# Patient Record
Sex: Male | Born: 1990 | Race: White | Hispanic: Yes | Smoking: Never smoker
Health system: Southern US, Community
[De-identification: ages and names within clinical notes are randomized; demographics above are authoritative.]

## PROBLEM LIST (undated history)

## (undated) ENCOUNTER — Emergency Department (HOSPITAL_COMMUNITY): Admission: EM | Payer: Self-pay | Source: Home / Self Care

## (undated) HISTORY — PX: APPENDECTOMY: SHX54

---
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
## 2013-05-01 ENCOUNTER — Encounter (HOSPITAL_COMMUNITY): Payer: Self-pay | Admitting: Emergency Medicine

## 2013-05-01 ENCOUNTER — Emergency Department (HOSPITAL_COMMUNITY)
Admission: EM | Admit: 2013-05-01 | Discharge: 2013-05-01 | Disposition: A | Discharge status: Home / Self Care | Payer: Self-pay | Attending: Emergency Medicine | Admitting: Emergency Medicine

## 2013-05-01 DIAGNOSIS — N400 Enlarged prostate without lower urinary tract symptoms: Secondary | ICD-10-CM | POA: Insufficient documentation

## 2013-05-01 LAB — CBC WITH DIFFERENTIAL/PLATELET
Basophils Absolute: 0 10*3/uL (ref 0.0–0.1)
Basophils Relative: 0 % (ref 0–1)
HCT: 40.7 % (ref 39.0–52.0)
Lymphocytes Relative: 6 % — ABNORMAL LOW (ref 12–46)
MCHC: 35.9 g/dL (ref 30.0–36.0)
Monocytes Absolute: 0.1 10*3/uL (ref 0.1–1.0)
Neutro Abs: 8.6 10*3/uL — ABNORMAL HIGH (ref 1.7–7.7)
Neutrophils Relative %: 94 % — ABNORMAL HIGH (ref 43–77)
RDW: 12.7 % (ref 11.5–15.5)
WBC: 9.2 10*3/uL (ref 4.0–10.5)

## 2013-05-01 LAB — URINALYSIS, ROUTINE W REFLEX MICROSCOPIC
Glucose, UA: NEGATIVE mg/dL
Ketones, ur: NEGATIVE mg/dL
Leukocytes, UA: NEGATIVE
Nitrite: NEGATIVE
Protein, ur: NEGATIVE mg/dL
pH: 7.5 (ref 5.0–8.0)

## 2013-05-01 LAB — BASIC METABOLIC PANEL
CO2: 27 mEq/L (ref 19–32)
Chloride: 102 mEq/L (ref 96–112)
Creatinine, Ser: 0.96 mg/dL (ref 0.50–1.35)
GFR calc Af Amer: >90 mL/min (ref >90)
Potassium: 3.6 mEq/L (ref 3.5–5.1)
Sodium: 140 mEq/L (ref 135–145)

## 2013-05-01 NOTE — ED Provider Notes (Signed)
History    This chart was scribed for non-physician, Junious Silk PA-C, practitioner working with Glynn Octave, MD by Donne Anon, ED Scribe. This patient was seen in room WTR7/WTR7 and the patient's care was started at 1607.   CSN: [id number]  Arrival date & time 05/01/13  1548   First MD Initiated Contact with Patient 05/01/13 1607      Chief Complaint  Patient presents with  . Benign Prostatic Hypertrophy     The history is provided by the patient. No language interpreter was used.   HPI Comments: Vincent Braun is a 22 y.o. male who presents to the Emergency Department complaining of an enlarged prostate described as pressure between his scrotum and his anus that he noticed 5 days ago while working out. It is not worsening, but it is not improving. He states he has been seen by his PCP who performed an exam and diagnosed him with an enlarged prostate. He would like labs run as well as a second opinion. He is scheduled to go to [facility] on Sunday. He was given Rocephin, Decadron and Toradol injection at his PCP. He denies urinary frequency, urgency, dysuria, trouble starting a stream, nausea, vomiting, fever, abdominal pain or any other pain.  History reviewed. No pertinent past medical history.  Past Surgical History  Procedure Laterality Date  . Appendectomy      No family history on file.  History  Substance Use Topics  . Smoking status: Never Smoker   . Smokeless tobacco: Not on file  . Alcohol Use: No      Review of Systems  Constitutional: Negative for fever.  Gastrointestinal: Negative for nausea, vomiting and abdominal pain.  All other systems reviewed and are negative.    Allergies  Review of patient's allergies indicates no known allergies.  Home Medications  No current outpatient prescriptions on file.  BP 133/54  Pulse 71  Temp(Src) 98.4 F (36.9 C) (Oral)  Resp 18  SpO2 100%  Physical Exam  Nursing note and vitals  reviewed. Constitutional: He is oriented to person, place, and time. He appears well-developed and well-nourished. No distress.  HENT:  Head: Normocephalic and atraumatic.  Right Ear: External ear normal.  Left Ear: External ear normal.  Nose: Nose normal.  Eyes: Conjunctivae are normal.  Neck: Normal range of motion. No tracheal deviation present.  Cardiovascular: Normal rate, regular rhythm and normal heart sounds.   Pulmonary/Chest: Effort normal and breath sounds normal. No stridor.  Abdominal: Soft. He exhibits no distension. There is no tenderness.  Musculoskeletal: Normal range of motion.  Neurological: He is alert and oriented to person, place, and time.  Skin: Skin is warm and dry. He is not diaphoretic.  Psychiatric: He has a normal mood and affect. His behavior is normal.    ED Course  Procedures (including critical care time) DIAGNOSTIC STUDIES: Oxygen Saturation is 100% on RA, normal by my interpretation.    COORDINATION OF CARE: 4:19 PM Discussed treatment plan which includes rectal exam with pt at bedside and pt agreed to plan.   6:02 PM Rechecked pt. Discussed various possible causes, diagnostic studies and treatments.   Labs Reviewed  CBC WITH DIFFERENTIAL - Abnormal; Notable for the following:    Neutrophils Relative % 94 (*)    Neutro Abs 8.6 (*)    Lymphocytes Relative 6 (*)    Lymphs Abs 0.5 (*)    Monocytes Relative 1 (*)    All other components within normal limits  BASIC  METABOLIC PANEL - Abnormal; Notable for the following:    Glucose, Bld 142 (*)    All other components within normal limits  URINALYSIS, ROUTINE W REFLEX MICROSCOPIC   No results found.   1. Enlarged prostate       MDM  Discussed with patient that he was given the correct medication at his PCP. Patient denied repeat rectal exam today. Encouraged him to take medication as prescribed by his PCP. CBC, BMP, UA without concerning acute findings. He is very concerned about prostate  cancer. Discussed at length that there are many reasons for your prostate to be enlarged and that we do not test for prostate cancer in the emergency department and he should follow up with his pcp in 1 week. Vital signs stable for discharge. Return instructions given. Vital signs stable for discharge. Patient / Family / Caregiver informed of clinical course, understand medical decision-making process, and agree with plan.    I personally performed the services described in this documentation, which was scribed in my presence. The recorded information has been reviewed and is accurate.         Mora Bellman, PA-C 05/02/13 1106

## 2013-05-01 NOTE — ED Notes (Signed)
Per pt, states he works out a lot and noticed something wrong with his "private area"-went to PCP and he did exam and noticed an  Enlarged prostate-wants results of blood work sooner than later so he came to ED

## 2013-05-01 NOTE — ED Notes (Signed)
Per pt, grandfather has prostate cancer

## 2013-05-02 NOTE — ED Provider Notes (Signed)
Medical screening examination/treatment/procedure(s) were performed by non-physician practitioner and as supervising physician I was immediately available for consultation/collaboration.   Glynn Octave, MD 05/02/13 1147

## 2013-05-19 ENCOUNTER — Emergency Department (HOSPITAL_COMMUNITY): Payer: Self-pay

## 2013-05-19 ENCOUNTER — Emergency Department (HOSPITAL_COMMUNITY)
Admission: EM | Admit: 2013-05-19 | Discharge: 2013-05-19 | Disposition: A | Discharge status: Home / Self Care | Payer: Self-pay | Attending: Emergency Medicine | Admitting: Emergency Medicine

## 2013-05-19 ENCOUNTER — Encounter (HOSPITAL_COMMUNITY): Payer: Self-pay | Admitting: Emergency Medicine

## 2013-05-19 DIAGNOSIS — N50811 Right testicular pain: Secondary | ICD-10-CM

## 2013-05-19 DIAGNOSIS — Z79899 Other long term (current) drug therapy: Secondary | ICD-10-CM | POA: Insufficient documentation

## 2013-05-19 DIAGNOSIS — N509 Disorder of male genital organs, unspecified: Secondary | ICD-10-CM | POA: Insufficient documentation

## 2013-05-19 LAB — URINALYSIS, ROUTINE W REFLEX MICROSCOPIC
Bilirubin Urine: NEGATIVE
Glucose, UA: NEGATIVE mg/dL
Hgb urine dipstick: NEGATIVE
Ketones, ur: NEGATIVE mg/dL
Protein, ur: NEGATIVE mg/dL
Urobilinogen, UA: 0.2 mg/dL (ref 0.0–1.0)

## 2013-05-19 NOTE — ED Notes (Signed)
Patient returned from ultrasound.

## 2013-05-19 NOTE — ED Notes (Addendum)
Pt presenting to ed with c/o groin pain and cramping in his testicle pt states he was seen here recently for the same and has been on antibiotics pt states he was told his prostate was enlarged at the clinic pt states he also had blood work drawn and was told that his labs were fine. 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 Pt denies hematuria at this time. Pt denies discharge. Pt states right testicle pain is worse than left

## 2013-05-19 NOTE — ED Notes (Signed)
Drinking water at bedside.

## 2013-05-19 NOTE — Progress Notes (Signed)
[facility] has seen patient and provided him with a oc application. °

## 2013-05-19 NOTE — ED Notes (Signed)
Patient transported to Ultrasound 

## 2013-05-19 NOTE — ED Provider Notes (Signed)
History    CSN: [id number] Arrival date & time 05/19/13  1328  First MD Initiated Contact with Patient 05/19/13 1508     Chief Complaint  Patient presents with  . Groin Pain   (Consider location/radiation/quality/duration/timing/severity/associated sxs/prior Treatment) HPI Comments: Pt comes in with right sided testicular pain. Seen recently at an urgent care, diagnosed with BPH, put on cipro. Pt finished the course, just 2 days ago, but states that he has developed a right testicular pain. Pt denies nausea, emesis, fevers, chills, chest pains, shortness of breath, headaches, abdominal pain, uti like symptoms. He has no weight loss, no hx of testicular tumors in the family. No trauma, no penile discharge, no hx of STD, or risk factors for the same.   Patient is a 22 y.o. male presenting with groin pain. The history is provided by the patient and medical records.  Groin Pain Pertinent negatives include no chest pain, no abdominal pain and no shortness of breath.   History reviewed. No pertinent past medical history. Past Surgical History  Procedure Laterality Date  . Appendectomy     No family history on file. History  Substance Use Topics  . Smoking status: Never Smoker   . Smokeless tobacco: Not on file  . Alcohol Use: No    Review of Systems  Constitutional: Negative for activity change and appetite change.  Respiratory: Negative for cough and shortness of breath.   Cardiovascular: Negative for chest pain.  Gastrointestinal: Negative for abdominal pain.  Genitourinary: Negative for dysuria.    Allergies  Review of patient's allergies indicates no known allergies.  Home Medications   Current Outpatient Rx  Name  Route  Sig  Dispense  Refill  . naproxen sodium (ANAPROX) 220 MG tablet   Oral   Take 220 mg by mouth 2 (two) times daily as needed (for pain).         . ciprofloxacin (CIPRO) 500 MG tablet   Oral   Take 500 mg by mouth 2 (two) times daily.           BP 140/55  Pulse 59  Temp(Src) 98.2 F (36.8 C) (Oral)  Resp 18  SpO2 100% Physical Exam  Constitutional: He is oriented to person, place, and time. He appears well-developed.  HENT:  Head: Normocephalic and atraumatic.  Eyes: Conjunctivae and EOM are normal. Pupils are equal, round, and reactive to light.  Neck: Normal range of motion. Neck supple.  Cardiovascular: Normal rate and regular rhythm.   Pulmonary/Chest: Effort normal and breath sounds normal.  Abdominal: Soft. Bowel sounds are normal. He exhibits no distension. There is no tenderness. There is no rebound and no guarding.  Genitourinary: Penis normal. No penile tenderness.  Right testicle slightly enlarged compared to left. No masses, no fluids, no cellulitis, no hernias   Neurological: He is alert and oriented to person, place, and time.  Skin: Skin is warm.    ED Course  Procedures (including critical care time) Labs Reviewed  URINALYSIS, ROUTINE W REFLEX MICROSCOPIC   US Scrotum  05/19/2013   *RADIOLOGY REPORT*  Clinical Data:  Right testicle pain  SCROTAL ULTRASOUND DOPPLER ULTRASOUND OF THE TESTICLES  Technique: Complete ultrasound examination of the testicles, epididymis, and other scrotal structures was performed.  Color and spectral Doppler ultrasound were also utilized to evaluate blood flow to the testicles.  Comparison:  Prior CT abdomen and pelvis 07/26/2007  Findings:  Right testis:  The right testis measures 3.6 x 1.9 x 2.8 cm. Normal homogeneous  testicular parenchyma and echogenicity.  No focal hypoattenuation or mass lesion.  The arterial and venous flow is identified on color Doppler imaging.  Left testis:  The left testicle measures 3.7 x 1.8 x 2.5 cm. Normal homogeneous testicular parenchyma and echogenicity.  No focal hypoattenuation or mass lesion.  The arterial and venous flow is identified on color Doppler imaging.  Right epididymis:  Normal in size and appearance.  Left epididymis:  Normal in  size and appearance.  Hydrocele:  Absent  Varicocele:  Absent  Pulsed Doppler interrogation of both testes demonstrates low resistance flow bilaterally.  IMPRESSION:  Unremarkable bilateral testicular ultrasound and Doppler evaluation.   Original Report Authenticated By: Malachy Moan, M.D.   [place] Art/ven Flow Abd Pelv Doppler  05/19/2013   *RADIOLOGY REPORT*  Clinical Data:  Right testicle pain  SCROTAL ULTRASOUND DOPPLER ULTRASOUND OF THE TESTICLES  Technique: Complete ultrasound examination of the testicles, epididymis, and other scrotal structures was performed.  Color and spectral Doppler ultrasound were also utilized to evaluate blood flow to the testicles.  Comparison:  Prior CT abdomen and pelvis 07/26/2007  Findings:  Right testis:  The right testis measures 3.6 x 1.9 x 2.8 cm. Normal homogeneous testicular parenchyma and echogenicity.  No focal hypoattenuation or mass lesion.  The arterial and venous flow is identified on color Doppler imaging.  Left testis:  The left testicle measures 3.7 x 1.8 x 2.5 cm. Normal homogeneous testicular parenchyma and echogenicity.  No focal hypoattenuation or mass lesion.  The arterial and venous flow is identified on color Doppler imaging.  Right epididymis:  Normal in size and appearance.  Left epididymis:  Normal in size and appearance.  Hydrocele:  Absent  Varicocele:  Absent  Pulsed Doppler interrogation of both testes demonstrates low resistance flow bilaterally.  IMPRESSION:  Unremarkable bilateral testicular ultrasound and Doppler evaluation.   Original Report Authenticated By: Malachy Moan, M.D.   1. Testicular pain, right     MDM  Pt comes in with right sided testicle pain. Recent AB use, so no concerns for infection. His testicular exam was pretty benign. Will get [place] - to r/o tumor, epididymitis. Will tx for epididymitis only if there is evidence of it on [place].   Derwood Kaplan, MD 05/19/13 1719

## 2014-07-09 IMAGING — US US SCROTUM
1 series · 14 of 25 positions shown · non-contrast
Comparison: Prior CT abdomen and pelvis 07/26/2007

CLINICAL DATA: Right testicle pain

SCROTAL ULTRASOUND
DOPPLER ULTRASOUND OF THE TESTICLES
TECHNIQUE: Complete ultrasound examination of the testicles,
epididymis, and other scrotal structures was performed.  Color and
spectral Doppler ultrasound were also utilized to evaluate blood
flow to the testicles.

[Series 1: us scrotum · 0.08mm/px · 14 of 34 slices shown]
[im 1/34]
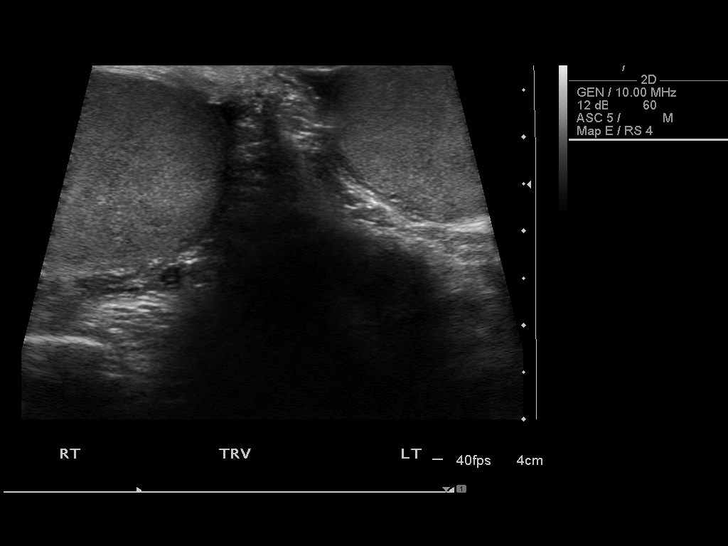
[im 3/34]
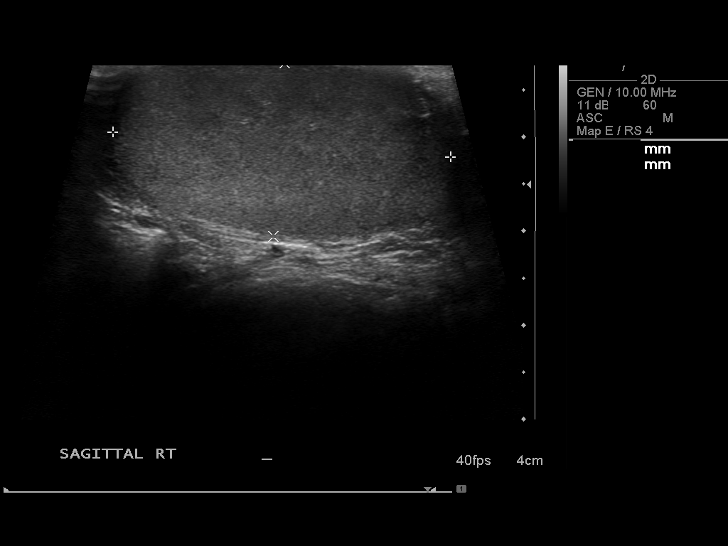
[im 6/34]
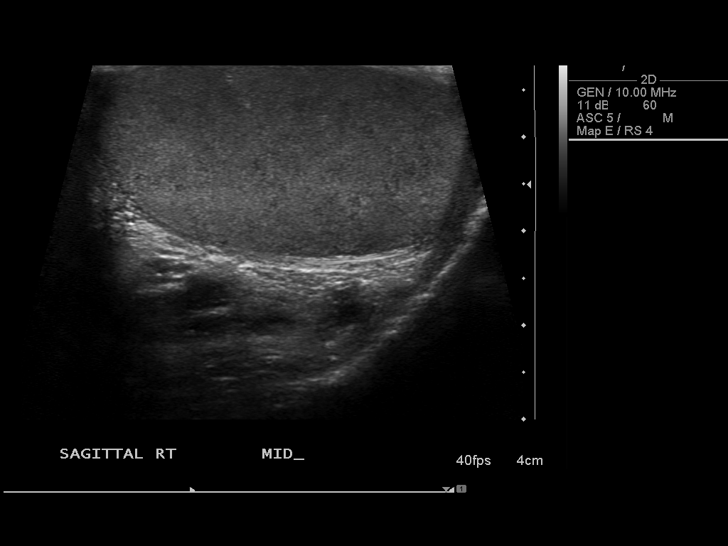
[im 9/34]
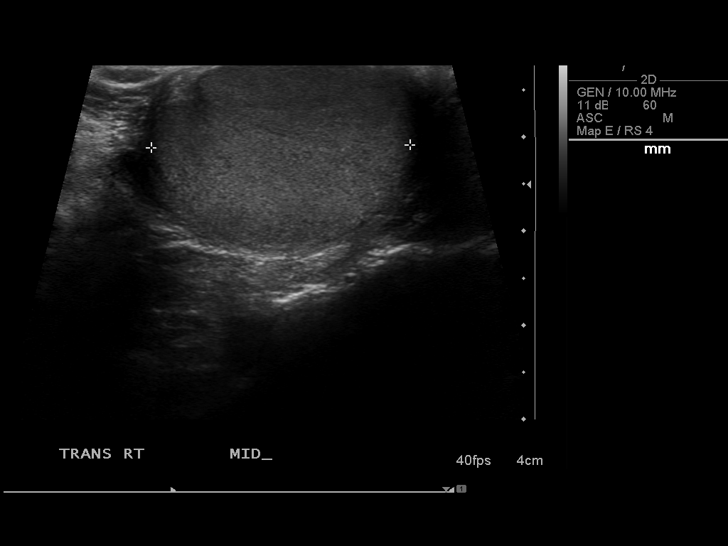
[im 12/34]
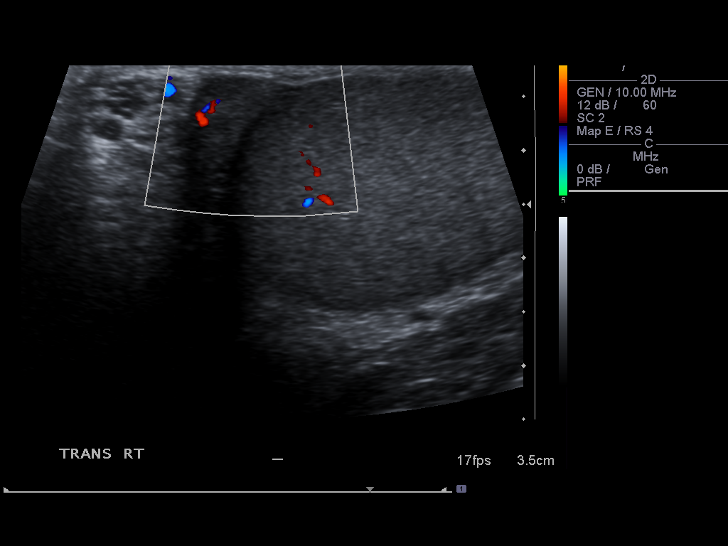
[im 13/34]
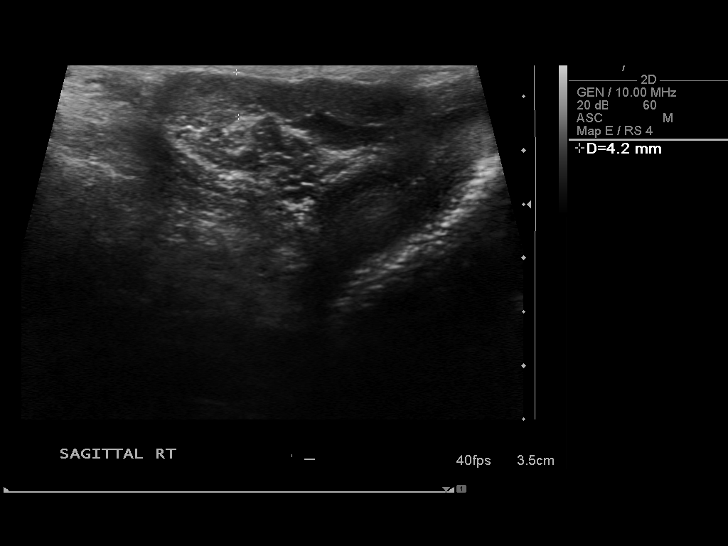
[im 16/34]
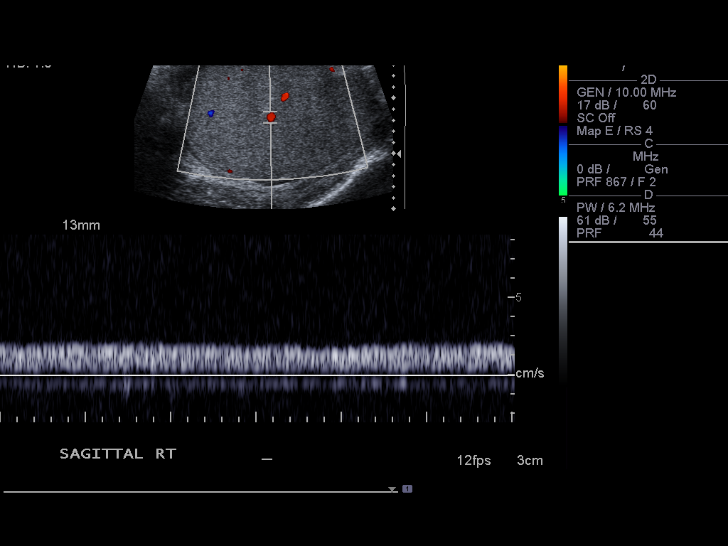
[im 18/34]
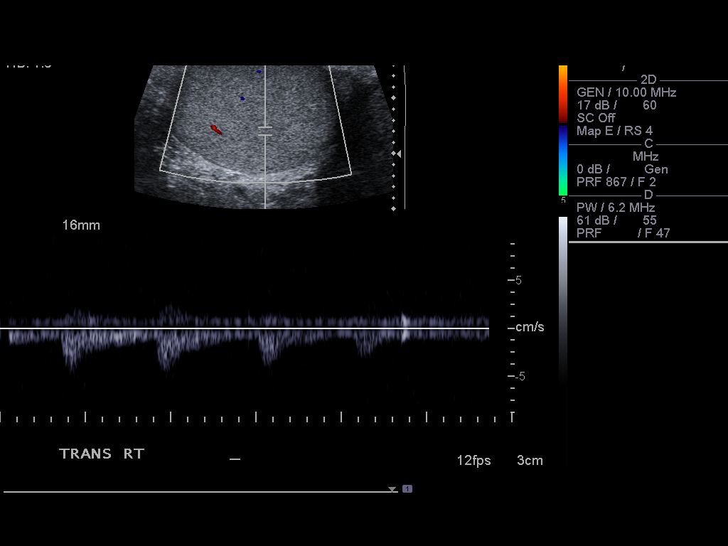
[im 21/34]
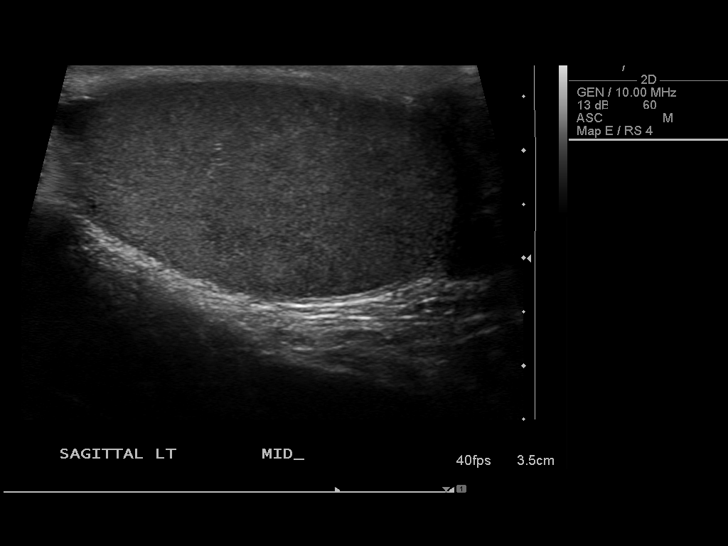
[im 23/34]
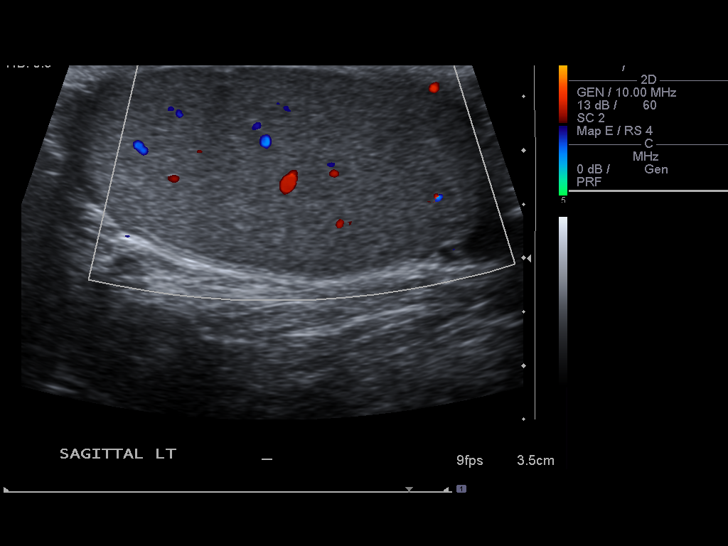
[im 25/34]
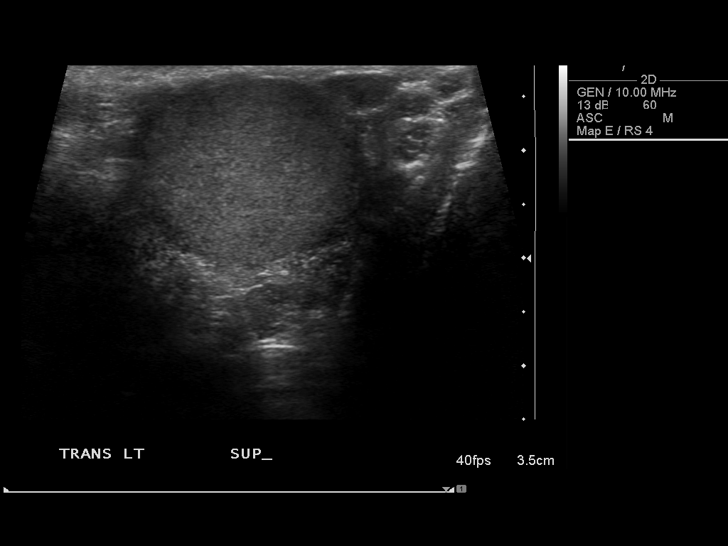
[im 28/34]
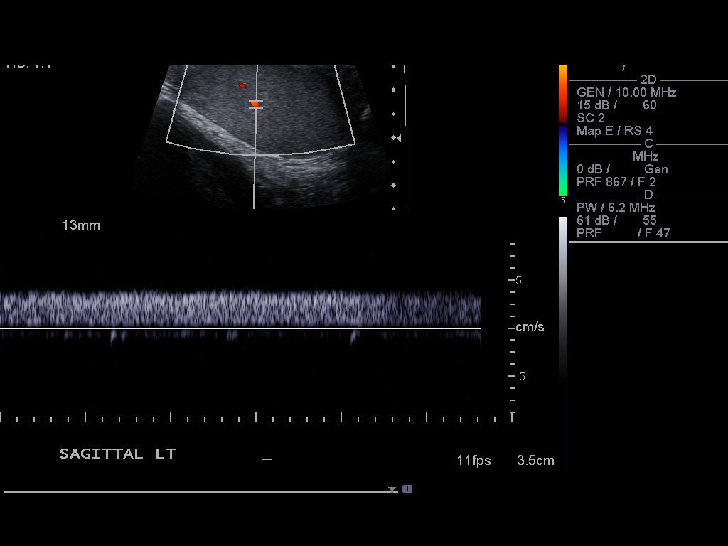
[im 31/34]
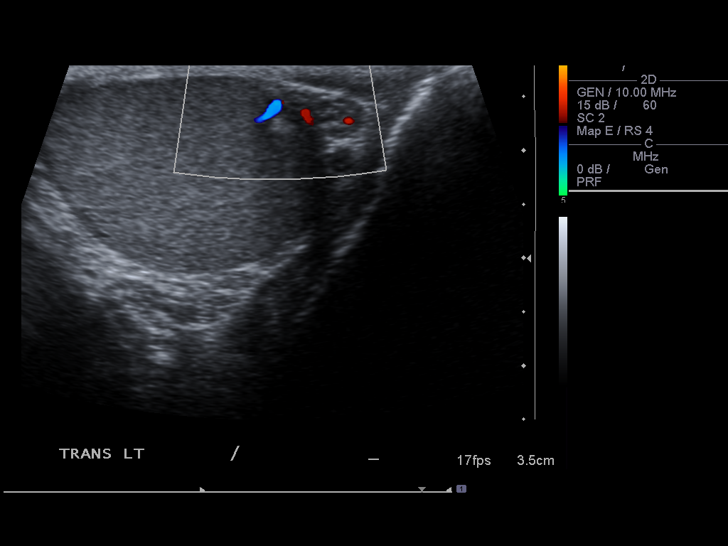
[im 34/34]
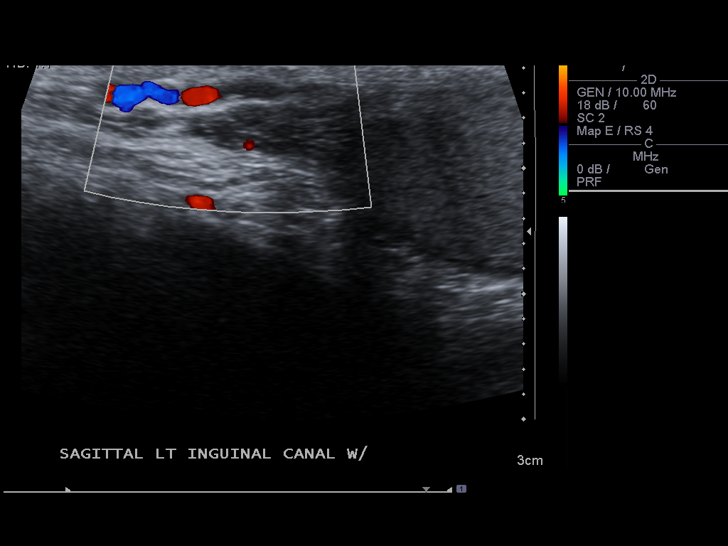

[14 of 25 positions shown; findings below may reference images not displayed]

FINDINGS: Right testis:  The right testis measures 3.6 x 1.9 x 2.8 cm.
Normal homogeneous testicular parenchyma and echogenicity.  No
focal hypoattenuation or mass lesion.  The arterial and venous flow
is identified on color Doppler imaging.

Left testis:  The left testicle measures 3.7 x 1.8 x 2.5 cm.
Normal homogeneous testicular parenchyma and echogenicity.  No
focal hypoattenuation or mass lesion.  The arterial and venous flow
is identified on color Doppler imaging.

Right epididymis:  Normal in size and appearance.

Left epididymis:  Normal in size and appearance.

Hydrocele:  Absent

Varicocele:  Absent

Pulsed Doppler interrogation of both testes demonstrates low
resistance flow bilaterally.
IMPRESSION: Unremarkable bilateral testicular ultrasound and Doppler
evaluation.

## 2015-02-24 ENCOUNTER — Emergency Department (HOSPITAL_COMMUNITY)

## 2015-02-24 ENCOUNTER — Emergency Department (HOSPITAL_COMMUNITY)
Admission: EM | Admit: 2015-02-24 | Discharge: 2015-02-25 | Disposition: A | Discharge status: Home / Self Care | Attending: Emergency Medicine | Admitting: Emergency Medicine

## 2015-02-24 ENCOUNTER — Encounter (HOSPITAL_COMMUNITY): Payer: Self-pay

## 2015-02-24 DIAGNOSIS — R1010 Upper abdominal pain, unspecified: Secondary | ICD-10-CM

## 2015-02-24 DIAGNOSIS — R1013 Epigastric pain: Secondary | ICD-10-CM | POA: Diagnosis not present

## 2015-02-24 DIAGNOSIS — R110 Nausea: Secondary | ICD-10-CM | POA: Diagnosis not present

## 2015-02-24 DIAGNOSIS — R6883 Chills (without fever): Secondary | ICD-10-CM | POA: Insufficient documentation

## 2015-02-24 DIAGNOSIS — S300XXA Contusion of lower back and pelvis, initial encounter: Secondary | ICD-10-CM | POA: Insufficient documentation

## 2015-02-24 DIAGNOSIS — Y998 Other external cause status: Secondary | ICD-10-CM | POA: Diagnosis not present

## 2015-02-24 DIAGNOSIS — Z9089 Acquired absence of other organs: Secondary | ICD-10-CM | POA: Diagnosis not present

## 2015-02-24 DIAGNOSIS — X58XXXA Exposure to other specified factors, initial encounter: Secondary | ICD-10-CM | POA: Insufficient documentation

## 2015-02-24 DIAGNOSIS — R109 Unspecified abdominal pain: Secondary | ICD-10-CM | POA: Diagnosis present

## 2015-02-24 DIAGNOSIS — Y92410 Unspecified street and highway as the place of occurrence of the external cause: Secondary | ICD-10-CM | POA: Diagnosis not present

## 2015-02-24 DIAGNOSIS — Y9389 Activity, other specified: Secondary | ICD-10-CM | POA: Insufficient documentation

## 2015-02-24 DIAGNOSIS — R1011 Right upper quadrant pain: Secondary | ICD-10-CM | POA: Insufficient documentation

## 2015-02-24 DIAGNOSIS — S20229A Contusion of unspecified back wall of thorax, initial encounter: Secondary | ICD-10-CM | POA: Diagnosis not present

## 2015-02-24 MED ORDER — ONDANSETRON HCL 4 MG/2ML IJ SOLN
4.0000 mg | Freq: Once | INTRAMUSCULAR | Status: AC
  Administered 2015-02-24: 4 mg via INTRAVENOUS
  Filled 2015-02-24: qty 2

## 2015-02-24 MED ORDER — SODIUM CHLORIDE 0.9 % IV BOLUS (SEPSIS)
1000.0000 mL | Freq: Once | INTRAVENOUS | Status: AC
  Administered 2015-02-24: 1000 mL via INTRAVENOUS

## 2015-02-24 MED ORDER — MORPHINE SULFATE 4 MG/ML IJ SOLN
4.0000 mg | Freq: Once | INTRAMUSCULAR | Status: AC
  Administered 2015-02-24: 4 mg via INTRAVENOUS
  Filled 2015-02-24: qty 1

## 2015-02-24 NOTE — ED Notes (Signed)
Informed the pt that a urine specimen is needed. 

## 2015-02-24 NOTE — ED Notes (Signed)
Pt complains of back pain for two days and today he states his stomach started hurting and feels like he has a fever

## 2015-02-24 NOTE — ED Provider Notes (Signed)
CSN: [id number]     Arrival date & time 02/24/15  2038 History   First MD Initiated Contact with Patient 02/24/15 2256     Chief Complaint  Patient presents with  . Abdominal Pain     (Consider location/radiation/quality/duration/timing/severity/associated sxs/prior Treatment) HPI Comments: Vincent Braun is a 24 y.o. male with a PSHx of appendectomy, who presents to the ED with complaints of midline low back pain 5 days after he traveled in a car where his back was against the seat for approximately 4 hours, states there's a small bruise where the pain is located. Reports the pain is 8/10, constant, achy and sharp, initially nonradiating but today he developed lateral abd pain which he thinks is his back pain possibly radiating into bilateral abdomen (points to approx mid-abdomen along lateral wall), worse with sitting for long periods of time, with no medications or alleviating factors tried prior to arrival. He reports that he has felt hot and cold chills but denies any known fevers, and reports some nausea. States that he has been having to do some heavy lifting with weight training last week. Denies any fevers, chest pain, shortness breath, cough, vomiting, diarrhea, constipation, hematochezia, melena, rectal pain, testicular pain or swelling, penile discharge or pain, dysuria, hematuria, urinary frequency, flank pain, numbness, tingling, weakness, headaches, cauda equina symptoms, leg pain, history of cancer, or IV drug use.  Patient is a 24 y.o. male presenting with back pain. The history is provided by the patient. No language interpreter was used.  Back Pain Location:  Lumbar spine (T12/L1 area) Quality:  Aching Radiates to: lateral abdomen, bilaterally. Pain severity:  Moderate Pain is:  Same all the time Onset quality:  Gradual Duration:  5 days Timing:  Constant Progression:  Unchanged Chronicity:  New Context: lifting heavy objects   Relieved by:  None tried Worsened by:   Sitting Ineffective treatments:  None tried Associated symptoms: abdominal pain   Associated symptoms: no bladder incontinence, no bowel incontinence, no chest pain, no dysuria, no fever, no headaches, no leg pain, no numbness, no paresthesias, no pelvic pain, no perianal numbness, no tingling and no weakness   Risk factors: no hx of cancer     History reviewed. No pertinent past medical history. Past Surgical History  Procedure Laterality Date  . Appendectomy     History reviewed. No pertinent family history. History  Substance Use Topics  . Smoking status: Never Smoker   . Smokeless tobacco: Not on file  . Alcohol Use: No    Review of Systems  Constitutional: Positive for chills (feels cold and then hot). Negative for fever.  Respiratory: Negative for shortness of breath.   Cardiovascular: Negative for chest pain.  Gastrointestinal: Positive for nausea (mild) and abdominal pain. Negative for vomiting, diarrhea, constipation, blood in stool, rectal pain and bowel incontinence.  Genitourinary: Negative for bladder incontinence, dysuria, frequency, hematuria, flank pain, discharge, scrotal swelling, penile pain, testicular pain and pelvic pain.  Musculoskeletal: Positive for back pain (T12/L1 area). Negative for myalgias, arthralgias, gait problem and neck pain.  Skin: Negative for rash.  Allergic/Immunologic: Negative for immunocompromised state.  Neurological: Negative for tingling, weakness, numbness, headaches and paresthesias.  Psychiatric/Behavioral: Negative for confusion.   10 Systems reviewed and are negative for acute change except as noted in the HPI.    Allergies  Review of patient's allergies indicates no known allergies.  Home Medications   Prior to Admission medications   Not on File   BP 117/47 mmHg  Pulse  77  Temp(Src) 98.1 F (36.7 C) (Oral)  Resp 20  Ht 5\' 10"  (1.778 m)  Wt 165 lb (74.844 kg)  BMI 23.68 kg/m2  SpO2 100% Physical Exam    Constitutional: He is oriented to person, place, and time. Vital signs are normal. He appears well-developed and well-nourished.  Non-toxic appearance. No distress.  Afebrile, nontoxic, NAD  HENT:  Head: Normocephalic and atraumatic.  Mouth/Throat: Oropharynx is clear and moist and mucous membranes are normal.  Eyes: Conjunctivae and EOM are normal. Right eye exhibits no discharge. Left eye exhibits no discharge.  Neck: Normal range of motion. Neck supple. No spinous process tenderness and no muscular tenderness present. No rigidity. Normal range of motion present.  Cardiovascular: Normal rate, regular rhythm, normal heart sounds and intact distal pulses.  Exam reveals no gallop and no friction rub.   No murmur heard. Pulmonary/Chest: Effort normal and breath sounds normal. No respiratory distress. He has no decreased breath sounds. He has no wheezes. He has no rhonchi. He has no rales.  Abdominal: Soft. Normal appearance and bowel sounds are normal. He exhibits no distension. There is tenderness in the right upper quadrant and epigastric area. There is no rigidity, no rebound, no guarding, no CVA tenderness, no tenderness at McBurney's point and negative Murphy's sign.    Soft, nondistended, +BS throughout, with mild TTP along R lateral abdomen/RUQ/epigastrum, no r/g/r, neg murphy's although it's uncomfortable for this exam, neg mcburney's, no CVA TTP   Musculoskeletal: Normal range of motion.       Lumbar back: He exhibits tenderness and bony tenderness. He exhibits normal range of motion, no swelling, no deformity and no spasm.       Back:  Thoracic and lumbar spine with FROM intact with small bruise over T12/L1 spinous processes with mild TTP, no bony stepoffs or deformities, no paraspinous muscle TTP or muscle spasms. Strength 5/5 in all extremities, sensation grossly intact in all extremities, negative SLR bilaterally, gait steady and nonantalgic. No disk space tenderness.  Neurological:  He is alert and oriented to person, place, and time. He has normal strength. No sensory deficit. Gait normal.  Skin: Skin is warm, dry and intact. No rash noted.  Psychiatric: He has a normal mood and affect.  Nursing note and vitals reviewed.   ED Course  Procedures (including critical care time) Labs Review Labs Reviewed  CBC WITH DIFFERENTIAL/PLATELET - Abnormal; Notable for the following:    WBC 12.4 (*)    Neutrophils Relative % 85 (*)    Neutro Abs 10.5 (*)    Lymphocytes Relative 7 (*)    All other components within normal limits  COMPREHENSIVE METABOLIC PANEL - Abnormal; Notable for the following:    Total Protein 8.5 (*)    All other components within normal limits  LIPASE, BLOOD  URINALYSIS, ROUTINE W REFLEX MICROSCOPIC    Imaging Review No results found.   EKG Interpretation None      MDM   Final diagnoses:  Upper abdominal pain    24 y.o. male with back pain after heavy lifting and car ride where his back was against a seat for ~4hrs, has small bruise over spinous process, no swelling or deformity, no bony step offs, neurovascularly intact in all extremities, no cauda equina or cord compression s/sx. No red flag s/sx for back pain. Doubt need for emergent imaging, likely contusion from being seated, or possibly a strain from heavy lifting. Additionally today he developed some b/l lateral abd pain, on exam  it seems only to be in RUQ/epigastric and R lateral abdomen. No flank tenderness. Will obtain labs and urine sample, will get abdominal U/S to eval kidneys and gallbladder. Pt declined rectal exam, and I do not feel it's absolutely necessary at this time given that his tenderness is RUQ/epigastric, and his symptoms don't match prostatitis or rectal abscess. Pt nontoxic and afebrile. Could be gastritis vs cholecystitis vs nephrolithiasis vs other. Will give fluids, morphine, zofran, and reassess shortly.   12:51 AM CBC w/diff resulting with slight leukocytosis,  could be from some hemoconcentration as his H/H is higher than prior labs. CMP WNL. Lipase WNL. U/A not yet performed, if this indicated urethritis or infectious source could potentially treat for early pyelo or prostatitis, but symptoms don't seem to fit. U/S performed, pending results. Pt tolerating PO well. Will likely d/c with zofran and pain meds and have him f/up with PCP in 5-7 days, but will await labs.   1:16 AM U/A unremarkable. U/S pending still, care signed over to Earley Favor NP at shift change who will f/up with these results and dispo accordingly.   BP 133/62 mmHg  Pulse 77  Temp(Src) 98.1 F (36.7 C) (Oral)  Resp 14  Ht  (1.778 m)  Wt 165 lb (74.844 kg)  BMI 23.68 kg/m2  SpO2 97%  Meds ordered this encounter  Medications  . sodium chloride 0.9 % bolus 1,000 mL    Sig:   . morphine 4 MG/ML injection 4 mg    Sig:   . ondansetron (ZOFRAN) injection 4 mg    Sig:      Laneshia Pina Camprubi-Soms, PA-C 02/25/15 1610  Devoria Albe, MD 02/25/15 0120

## 2015-02-25 LAB — CBC WITH DIFFERENTIAL/PLATELET
BASOS PCT: 0 % (ref 0–1)
Basophils Absolute: 0 10*3/uL (ref 0.0–0.1)
EOS PCT: 2 % (ref 0–5)
Eosinophils Absolute: 0.3 10*3/uL (ref 0.0–0.7)
HCT: 43.9 % (ref 39.0–52.0)
Hemoglobin: 15.3 g/dL (ref 13.0–17.0)
LYMPHS PCT: 7 % — AB (ref 12–46)
Lymphs Abs: 0.9 10*3/uL (ref 0.7–4.0)
MCH: 30.7 pg (ref 26.0–34.0)
MCHC: 34.9 g/dL (ref 30.0–36.0)
MCV: 88 fL (ref 78.0–100.0)
MONOS PCT: 6 % (ref 3–12)
Monocytes Absolute: 0.8 10*3/uL (ref 0.1–1.0)
NEUTROS ABS: 10.5 10*3/uL — AB (ref 1.7–7.7)
Neutrophils Relative %: 85 % — ABNORMAL HIGH (ref 43–77)
PLATELETS: 212 10*3/uL (ref 150–400)
RBC: 4.99 MIL/uL (ref 4.22–5.81)
RDW: 13.1 % (ref 11.5–15.5)
WBC: 12.4 10*3/uL — ABNORMAL HIGH (ref 4.0–10.5)

## 2015-02-25 LAB — COMPREHENSIVE METABOLIC PANEL
ALT: 19 U/L (ref 0–53)
AST: 19 U/L (ref 0–37)
Albumin: 4.9 g/dL (ref 3.5–5.2)
Alkaline Phosphatase: 79 U/L (ref 39–117)
Anion gap: 9 (ref 5–15)
BUN: 19 mg/dL (ref 6–23)
CALCIUM: 10 mg/dL (ref 8.4–10.5)
CO2: 29 mmol/L (ref 19–32)
Chloride: 102 mmol/L (ref 96–112)
Creatinine, Ser: 0.96 mg/dL (ref 0.50–1.35)
GFR calc non Af Amer: >90 mL/min (ref >90)
GLUCOSE: 99 mg/dL (ref 70–99)
Potassium: 3.5 mmol/L (ref 3.5–5.1)
SODIUM: 140 mmol/L (ref 135–145)
TOTAL PROTEIN: 8.5 g/dL — AB (ref 6.0–8.3)
Total Bilirubin: 0.7 mg/dL (ref 0.3–1.2)

## 2015-02-25 LAB — URINALYSIS, ROUTINE W REFLEX MICROSCOPIC
BILIRUBIN URINE: NEGATIVE
GLUCOSE, UA: NEGATIVE mg/dL
Hgb urine dipstick: NEGATIVE
Ketones, ur: NEGATIVE mg/dL
Leukocytes, UA: NEGATIVE
NITRITE: NEGATIVE
PH: 8 (ref 5.0–8.0)
Protein, ur: NEGATIVE mg/dL
SPECIFIC GRAVITY, URINE: 1.019 (ref 1.005–1.030)
UROBILINOGEN UA: 1 mg/dL (ref 0.0–1.0)

## 2015-02-25 LAB — LIPASE, BLOOD: LIPASE: 24 U/L (ref 11–59)

## 2015-02-25 MED ORDER — ONDANSETRON 4 MG PO TBDP: 4.0000 mg | ORAL_TABLET | Freq: Three times a day (TID) | ORAL | Status: DC | PRN

## 2015-02-25 NOTE — Discharge Instructions (Signed)
Abdominal Pain Many things can cause belly (abdominal) pain. Most times, the belly pain is not dangerous. Many cases of belly pain can be watched and treated at home. HOME CARE   Do not take medicines that help you go poop (laxatives) unless told to by your doctor.  Only take medicine as told by your doctor.  Eat or drink as told by your doctor. Your doctor will tell you if you should be on a special diet. GET HELP IF:  You do not know what is causing your belly pain.  You have belly pain while you are sick to your stomach (nauseous) or have runny poop (diarrhea).  You have pain while you pee or poop.  Your belly pain wakes you up at night.  You have belly pain that gets worse or better when you eat.  You have belly pain that gets worse when you eat fatty foods.  You have a fever. GET HELP RIGHT AWAY IF:   The pain does not go away within 2 hours.  You keep throwing up (vomiting).  The pain changes and is only in the right or left part of the belly.  You have bloody or tarry looking poop. MAKE SURE YOU:   Understand these instructions.  Will watch your condition.  Will get help right away if you are not doing well or get worse. Document Released: 05/01/2008 Document Revised: 11/18/2013 Document Reviewed: 07/23/2013 [employer] Patient Information 2015 [place] [place]. This information is not intended to replace advice given to you by your health care provider. Make sure you discuss any questions you have with your health care provider. Your labs are within normal parameters.  Her ultrasound is normal. You have been given a prescription for Zofran to help control nausea.  Please follow-up with your primary care physician if you continue to have problems

## 2015-02-25 NOTE — ED Provider Notes (Signed)
Ultrasound normal.  Patient has been discharged home with prescription for Zofran and follow-up with his primary care physician  Earley FavorGail Shahana Capes, NP 02/25/15 [phone]  Blake DivineJohn Wofford, MD 02/25/15 1312

## 2015-02-27 ENCOUNTER — Ambulatory Visit (INDEPENDENT_AMBULATORY_CARE_PROVIDER_SITE_OTHER): Admitting: Family Medicine

## 2015-02-27 ENCOUNTER — Other Ambulatory Visit: Payer: Self-pay | Admitting: Family Medicine

## 2015-02-27 VITALS — BP 114/70 | HR 57 | Temp 98.6°F | Resp 16 | Ht 70.0 in | Wt 161.5 lb

## 2015-02-27 DIAGNOSIS — R21 Rash and other nonspecific skin eruption: Secondary | ICD-10-CM | POA: Diagnosis not present

## 2015-02-27 NOTE — Progress Notes (Signed)
Patient ID: Vincent Braun, male   DOB: 05-Sep-1991, 24 y.o.   MRN: [id number]   This chart was scribed for Elvina Sidle, MD by [facility], medical scribe at [facility].The patient was seen in exam room 12 and the patient's care was started at 2:32 PM.  Patient ID: Vincent Braun MRN: [id number], DOB: 1991/10/01, 24 y.o. Date of Encounter: 02/27/2015  Primary Physician: No PCP Per Patient  Chief Complaint:  Chief Complaint  Patient presents with  . Bumps in Pubic Area    x 5 days   HPI:  Vincent Braun is a 24 y.o. male who presents to [facility] complaining of gradually improving bumps in his pubic area ongoing for 5 days. He has itching as an associated symptom. Pt states he has used an OTC antibiotic cream that has relieved the itching. He was seen in the ED for back pain and a standard blood test and urine test, he is concerned about sexually transmitted diseases and would like to check. He denies discharge, or pain. Pt is a [employer] and has taken leave to visit family. He is stationed in [employer].  History reviewed. No pertinent past medical history.   Home Meds: Prior to Admission medications   Not on File   Allergies: No Known Allergies  History   Social History  . Marital Status: Married    Spouse Name: N/A  . Number of Children: N/A  . Years of Education: N/A   Occupational History  . Not on file.   Social History Main Topics  . Smoking status: Never Smoker   . Smokeless tobacco: Never Used  . Alcohol Use: No  . Drug Use: No  . Sexual Activity: Not on file   Other Topics Concern  . Not on file   Social History Narrative     Review of Systems: Constitutional: negative for chills, fever, night sweats, weight changes, or fatigue  HEENT: negative for vision changes, hearing loss, congestion, rhinorrhea, ST, epistaxis, or sinus pressure Cardiovascular: negative for chest pain or palpitations Respiratory:  negative for hemoptysis, wheezing, shortness of breath, or cough Abdominal: negative for abdominal pain, nausea, vomiting, diarrhea, or constipation Dermatological: negative for rash Neurologic: negative for headache, dizziness, or syncope All other systems reviewed and are otherwise negative with the exception to those above and in the HPI.  Physical Exam: Blood pressure 114/70, pulse 57, temperature 98.6 F (37 C), temperature source Oral, resp. rate 16, height  (1.778 m), weight 161 lb 8 oz (73.256 kg), SpO2 99 %., Body mass index is 23.17 kg/(m^2). General: Well developed, well nourished, in no acute distress. Head: Normocephalic, atraumatic, eyes without discharge, sclera non-icteric, nares are without discharge. Bilateral auditory canals clear, TM's are without perforation, pearly grey and translucent with reflective cone of light bilaterally. Oral cavity moist, posterior pharynx without exudate, erythema, peritonsillar abscess, or post nasal drip.  Neck: Supple. No thyromegaly. Full ROM. No lymphadenopathy. Lungs: Clear bilaterally to auscultation without wheezes, rales, or rhonchi. Breathing is unlabored. Heart: RRR with S1 S2. No murmurs, rubs, or gallops appreciated. Abdomen: Soft, non-tender, non-distended with normoactive bowel sounds. No hepatomegaly. No rebound/guarding. No obvious abdominal masses. Msk:  Strength and tone normal for age. Extremities/Skin: Warm and dry. Multiple small papules at the base of the penis, some erythematous and some simply with a weight tip Neuro: Alert and oriented X 3. Moves all extremities spontaneously. Gait is normal. CNII-XII grossly in tact. Psych:  Responds to questions appropriately with a normal affect.     ASSESSMENT AND PLAN:  24 y.o. year old male with folliculitis  This chart was scribed in my presence and reviewed by me personally.    ICD-9-CM ICD-10-CM   1. Rash and nonspecific skin eruption 782.1 R21 GC/Chlamydia Probe Amp       HSV(herpes simplex vrs) 1+2 ab-IgG     RPR     HIV antibody    Signed, Elvina SidleKurt Mishka Stegemann, MD 02/27/2015 2:32 PM

## 2015-02-27 NOTE — Patient Instructions (Signed)

## 2015-02-28 LAB — RPR

## 2015-02-28 LAB — HIV ANTIBODY (ROUTINE TESTING W REFLEX)
HIV 1&2 Ab, 4th Generation: NONREACTIVE
HIV 1&2 Ab, 4th Generation: NONREACTIVE

## 2015-02-28 LAB — HSV(HERPES SIMPLEX VRS) I + II AB-IGG

## 2015-03-01 LAB — HSV(HERPES SIMPLEX VRS) I + II AB-IGG
HSV 1 Glycoprotein G Ab, IgG: 8.24 IV — ABNORMAL HIGH
HSV 2 Glycoprotein G Ab, IgG: 0.12 IV

## 2015-03-03 ENCOUNTER — Encounter: Payer: Self-pay | Admitting: Radiology

## 2015-03-03 LAB — GC/CHLAMYDIA PROBE AMP
CT Probe RNA: NEGATIVE
GC Probe RNA: NEGATIVE

## 2016-04-16 IMAGING — US US ABDOMEN COMPLETE
1 series · 14 of 25 positions shown · non-contrast
Comparison: CT abdomen/ pelvis 07/26/2007

CLINICAL DATA: Right upper quadrant and epigastric tenderness.

EXAM:
ULTRASOUND ABDOMEN COMPLETE

[Series 1: us abdomen complete · 0.21mm/px · 14 of 185 slices shown]
[im 1/185]
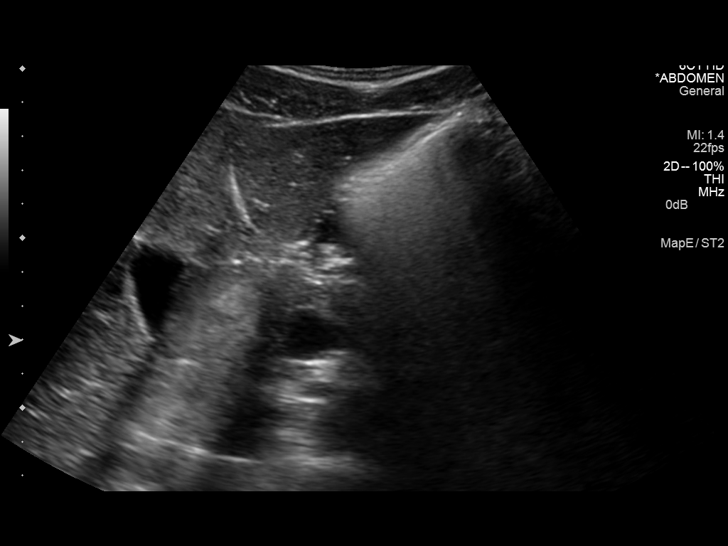
[im 16/185]
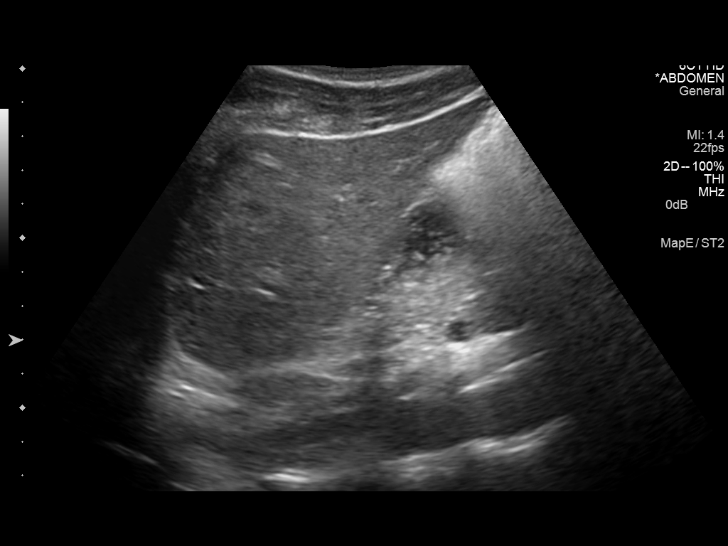
[im 31/185]
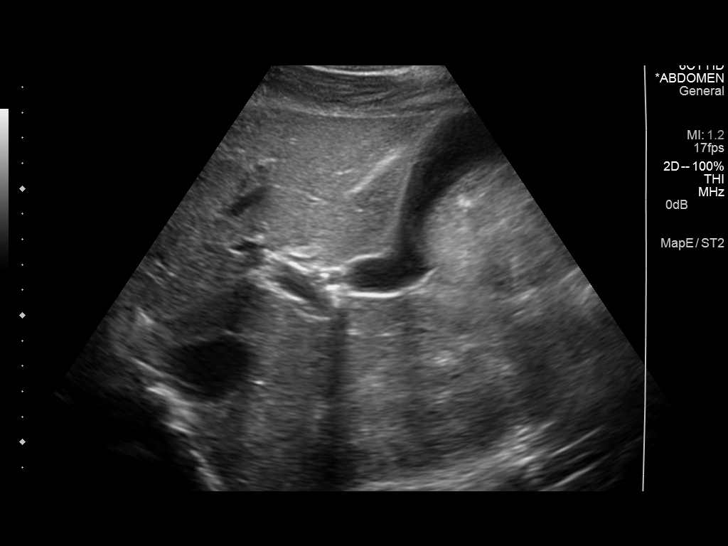
[im 47/185]
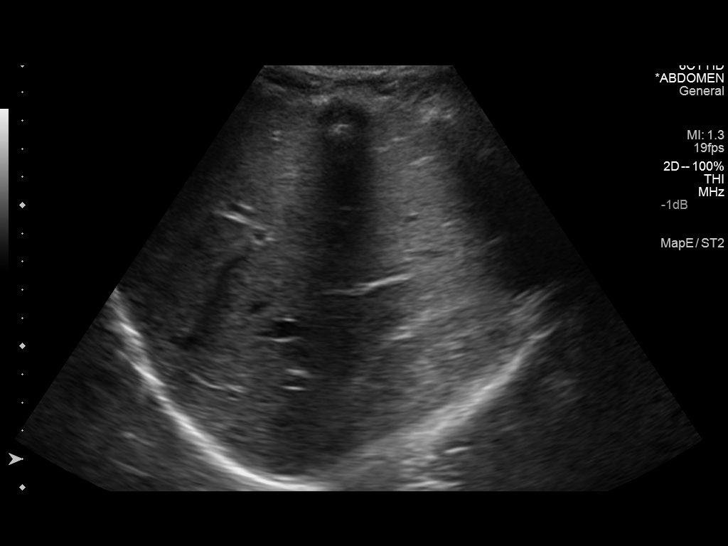
[im 62/185]
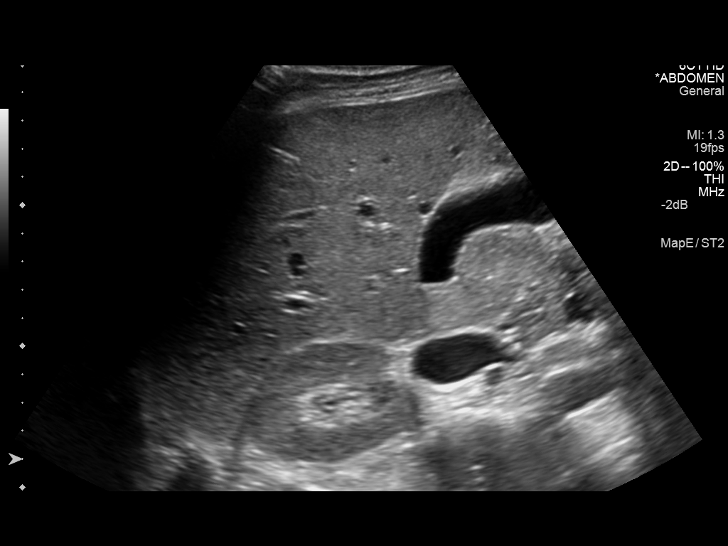
[im 70/185]
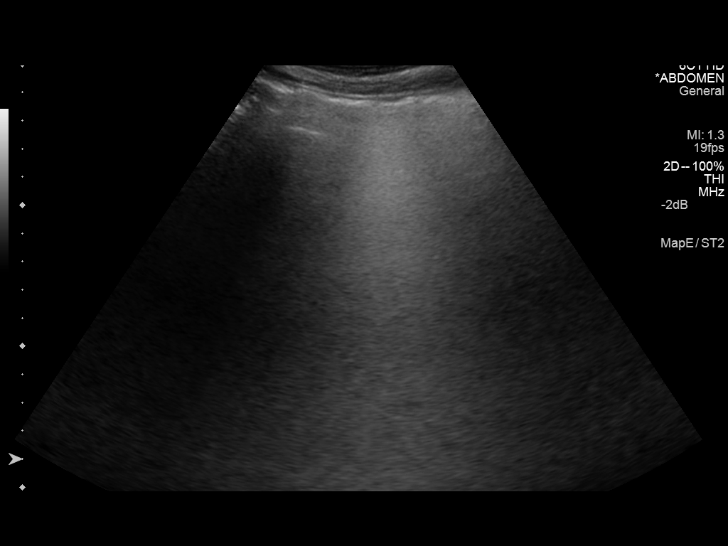
[im 85/185]
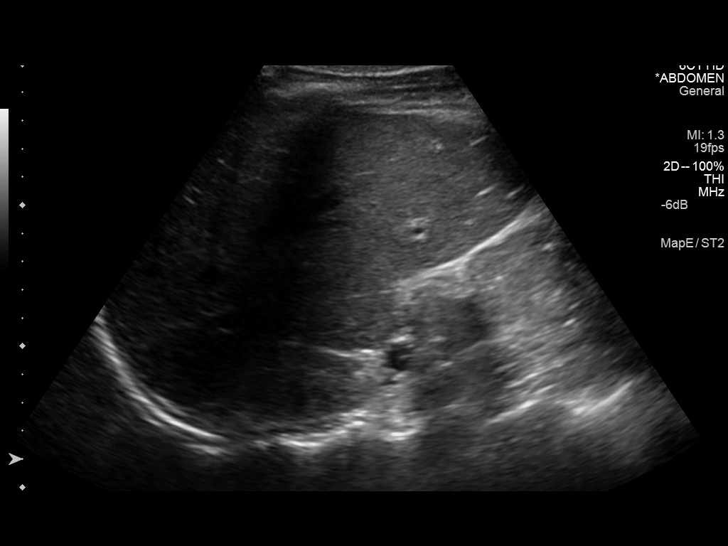
[im 100/185]
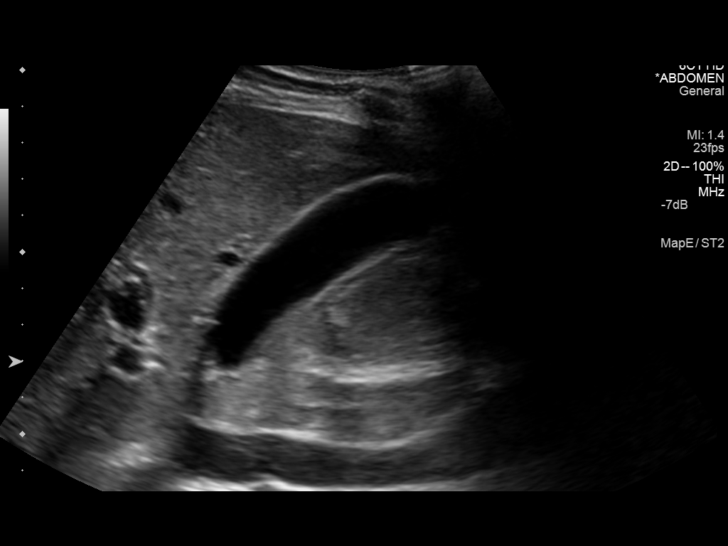
[im 116/185]
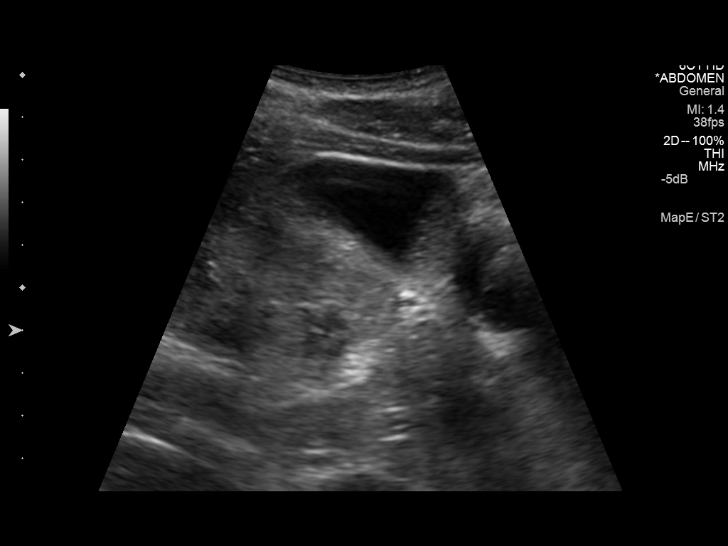
[im 123/185]
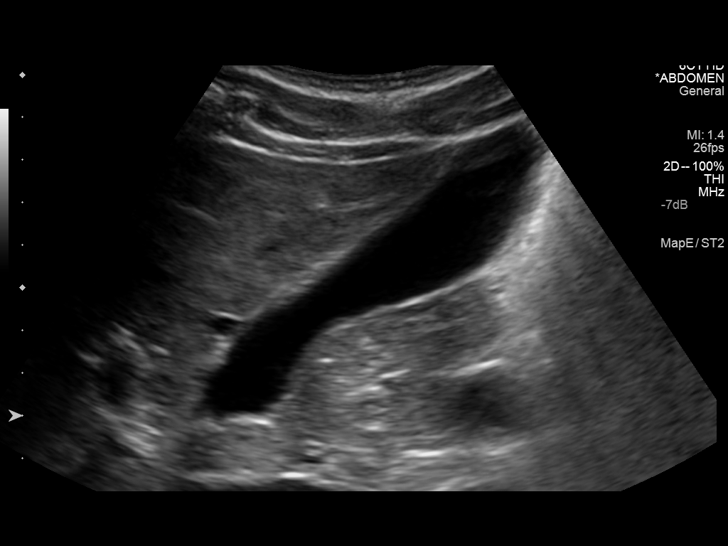
[im 139/185]
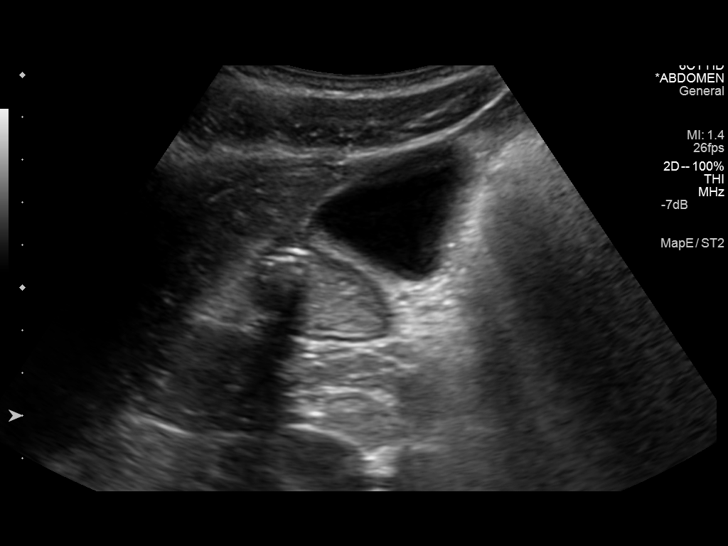
[im 154/185]
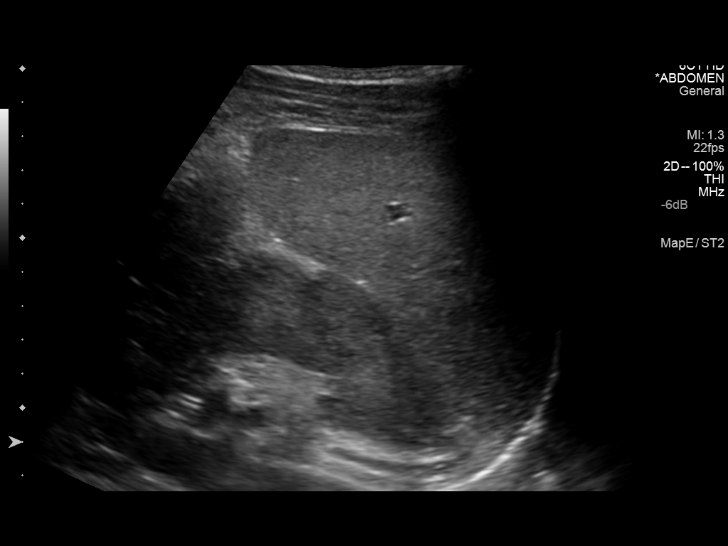
[im 169/185]
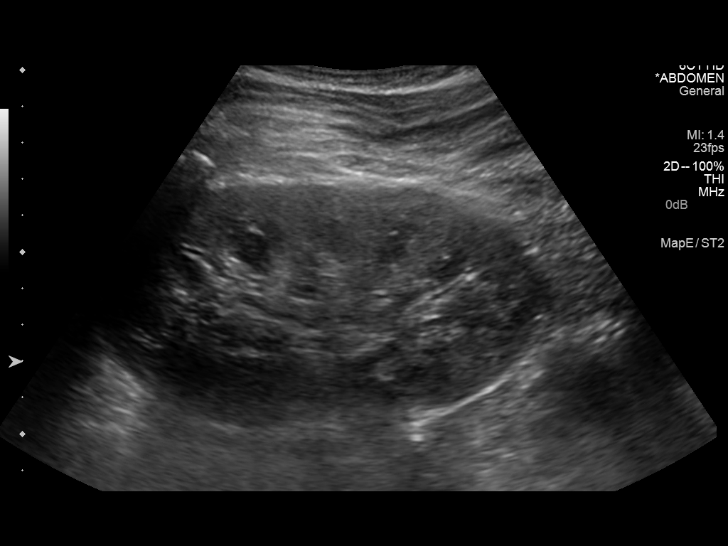
[im 185/185]
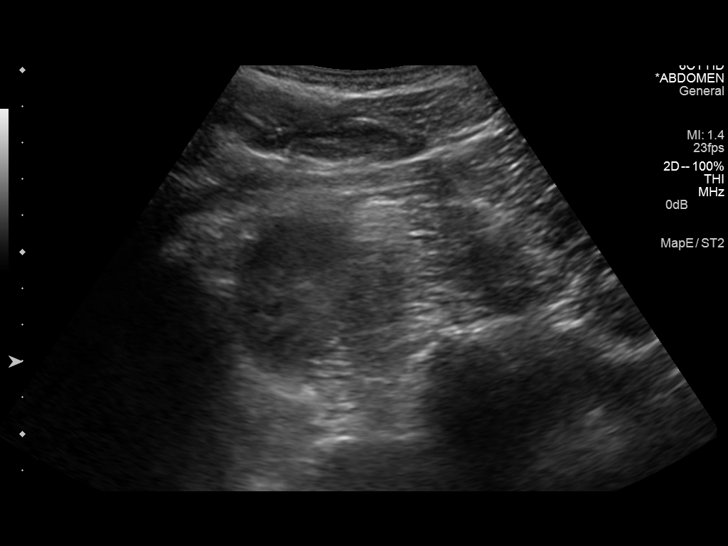

[14 of 25 positions shown; findings below may reference images not displayed]

FINDINGS: Gallbladder: No gallstones or wall thickening visualized.
Gallbladder wall thickness of 1.6 mm. No sonographic Murphy sign
noted.

Common bile duct: Diameter: 6.5 mm proximally, 3.8 mm distally, this
is unchanged from prior CT.

Liver: No focal lesion identified. Within normal limits in
parenchymal echogenicity.

IVC: No abnormality visualized.

Pancreas: Obscured by overlying bowel gas.

Spleen: Size and appearance within normal limits.

Right Kidney: Length: 12.2 cm. Echogenicity within normal limits. No
mass or hydronephrosis visualized. No shadowing stone.

Left Kidney: Length: 11.7 cm. Echogenicity within normal limits. No
mass or hydronephrosis visualized. No shadowing stone.

Abdominal aorta: Obscured by bowel gas.

Other findings: None.
IMPRESSION: Normal abdominal ultrasound. Mild prominence of the proximal common
bile duct, unchanged from prior CT, likely normal for this patient.

## 2017-01-07 ENCOUNTER — Encounter (HOSPITAL_COMMUNITY): Payer: Self-pay

## 2017-01-07 ENCOUNTER — Emergency Department (HOSPITAL_COMMUNITY)
Admission: EM | Admit: 2017-01-07 | Discharge: 2017-01-07 | Disposition: A | Discharge status: Home / Self Care | Attending: Emergency Medicine | Admitting: Emergency Medicine

## 2017-01-07 DIAGNOSIS — S1191XA Laceration without foreign body of unspecified part of neck, initial encounter: Secondary | ICD-10-CM

## 2017-01-07 DIAGNOSIS — Y929 Unspecified place or not applicable: Secondary | ICD-10-CM | POA: Diagnosis not present

## 2017-01-07 DIAGNOSIS — Y939 Activity, unspecified: Secondary | ICD-10-CM | POA: Insufficient documentation

## 2017-01-07 DIAGNOSIS — S0993XA Unspecified injury of face, initial encounter: Secondary | ICD-10-CM

## 2017-01-07 DIAGNOSIS — Z79899 Other long term (current) drug therapy: Secondary | ICD-10-CM | POA: Diagnosis not present

## 2017-01-07 DIAGNOSIS — W228XXA Striking against or struck by other objects, initial encounter: Secondary | ICD-10-CM | POA: Insufficient documentation

## 2017-01-07 DIAGNOSIS — Y999 Unspecified external cause status: Secondary | ICD-10-CM | POA: Insufficient documentation

## 2017-01-07 DIAGNOSIS — S0181XA Laceration without foreign body of other part of head, initial encounter: Secondary | ICD-10-CM | POA: Insufficient documentation

## 2017-01-07 MED ORDER — IBUPROFEN 800 MG PO TABS
800.0000 mg | ORAL_TABLET | Freq: Once | ORAL | Status: AC
  Administered 2017-01-07: 800 mg via ORAL
  Filled 2017-01-07: qty 1

## 2017-01-07 MED ORDER — IBUPROFEN 800 MG PO TABS: 800.0000 mg | ORAL_TABLET | Freq: Three times a day (TID) | ORAL | 0 refills | Status: AC | PRN

## 2017-01-07 NOTE — ED Triage Notes (Signed)
Pole flew up and hit him in right neck area with small laceration noted bleeding controlled no LOC also states knocked out tooth on right lower jaw no active bleeding noted.

## 2017-01-07 NOTE — Discharge Instructions (Signed)
Ibuprofen as needed for pain.  Call the dentist Monday morning to schedule follow up appointment.  See attached information for home wound care.  Return to ER for new or worsening symptoms, any additional concerns.

## 2017-01-07 NOTE — ED Provider Notes (Signed)
[facility]-EMERGENCY DEPT Provider Note   CSN: [id number] Arrival date & time: 01/07/17  0355     History   Chief Complaint Chief Complaint  Patient presents with  . Laceration    pole hit him on right side of neck with small laceration noted     HPI Scharlene CornMaurice A Martine is a 26 y.o. male.  The history is provided by the patient and medical records. No language interpreter was used.  Laceration     Scharlene CornMaurice A Beynon is an otherwise healthy 26 y.o. male  who presents to the Emergency Department for laceration to right jaw which occurred a few hours prior to arrival. Patient states he was trying to tow a friend's truck when a pole came up and hit in the neck/mouth. He sustained a laceration to jaw/upper neck as well as a chipped back right tooth which is also causing him pain. No LOC. No headache or neck pain. No medications taken prior to arrival for symptoms. No alleviating or aggravating factors noted. Tetanus up-to-date.    History reviewed. No pertinent past medical history.  There are no active problems to display for this patient.   Past Surgical History:  Procedure Laterality Date  . APPENDECTOMY         Home Medications    Prior to Admission medications   Medication Sig Start Date End Date Taking? Authorizing Provider  ibuprofen (ADVIL,MOTRIN) 800 MG tablet Take 1 tablet (800 mg total) by mouth every 8 (eight) hours as needed. 01/07/17   Chase PicketJaime Pilcher Ward, PA-C    Family History History reviewed. No pertinent family history.  Social History Social History  Substance Use Topics  . Smoking status: Never Smoker  . Smokeless tobacco: Never Used  . Alcohol use No     Allergies   Patient has no known allergies.   Review of Systems Review of Systems  HENT: Positive for dental problem.   Gastrointestinal: Negative for nausea and vomiting.  Musculoskeletal: Negative for back pain and neck pain.  Skin: Positive for wound.  Neurological: Negative for  headaches.     Physical Exam Updated Vital Signs BP 147/77 (BP Location: Right Arm)   Pulse 78   Temp 98 F (36.7 C) (Oral)   Resp 18   Ht 5\' 10"  (1.778 m)   Wt 73 kg   SpO2 97%   BMI 23.10 kg/m   Physical Exam  Constitutional: He is oriented to person, place, and time. He appears well-developed and well-nourished. No distress.  HENT:  Head: Normocephalic.    Mouth/Throat:    Neck:  Full ROM without pain. No midline or paraspinal tenderness.   Cardiovascular: Normal rate, regular rhythm and normal heart sounds.   No murmur heard. Pulmonary/Chest: Effort normal and breath sounds normal. No respiratory distress.  Neurological: He is alert and oriented to person, place, and time.  Skin: Skin is warm and dry.  Psychiatric: He has a normal mood and affect.  Nursing note and vitals reviewed.    ED Treatments / Results  Labs (all labs ordered are listed, but only abnormal results are displayed) Labs Reviewed - No data to display  EKG  EKG Interpretation None       Radiology No results found.  Procedures Procedures (including critical care time)  LACERATION REPAIR Performed by: Chase PicketJaime Pilcher Ward Authorized by: Chase PicketJaime Pilcher Ward Consent: Verbal consent obtained. Risks and benefits: risks, benefits and alternatives were discussed Consent given by: patient Patient identity confirmed: provided demographic data Prepped and  Draped in normal sterile fashion Wound explored Laceration Location: Right lower jaw Laceration Length: 1 cm No Foreign Bodies seen or palpated Anesthesia: none Irrigation method: syringe Amount of cleaning: standard Skin closure: dermabond  Patient tolerance: Patient tolerated the procedure well with no immediate complications.  Medications Ordered in ED Medications  ibuprofen (ADVIL,MOTRIN) tablet 800 mg (800 mg Oral Given 01/07/17 0514)     Initial Impression / Assessment and Plan / ED Course  I have reviewed the triage vital  signs and the nursing notes.  Pertinent labs & imaging results that were available during my care of the patient were reviewed by me and considered in my medical decision making (see chart for details).    RAAHIL ONG is a 26 y.o. male who presents to ED for laceration to right jaw as well as chipped tooth from a pole hitting him while trying to tow a truck. Tetanus is up to date. Laceration repaired with dermabond as dictated above. Uptodate patient education on wound care provided. Patient discharge home with rx for ibuprofen. Follow up with dentistry. All questions answered.   Final Clinical Impressions(s) / ED Diagnoses   Final diagnoses:  Dental injury, initial encounter  Laceration of neck, initial encounter    New Prescriptions Discharge Medication List as of 01/07/2017  5:44 AM    START taking these medications   Details  ibuprofen (ADVIL,MOTRIN) 800 MG tablet Take 1 tablet (800 mg total) by mouth every 8 (eight) hours as needed., Starting Sun 01/07/2017, Print         [facility] Ward, PA-C 01/07/17 0646    April Palumbo, MD 01/07/17 [phone]

## 2017-02-24 ENCOUNTER — Emergency Department (HOSPITAL_COMMUNITY)
Admission: EM | Admit: 2017-02-24 | Discharge: 2017-02-24 | Disposition: A | Discharge status: Home / Self Care | Attending: Emergency Medicine | Admitting: Emergency Medicine

## 2017-02-24 ENCOUNTER — Encounter (HOSPITAL_COMMUNITY): Payer: Self-pay

## 2017-02-24 DIAGNOSIS — J039 Acute tonsillitis, unspecified: Secondary | ICD-10-CM

## 2017-02-24 DIAGNOSIS — J029 Acute pharyngitis, unspecified: Secondary | ICD-10-CM | POA: Diagnosis present

## 2017-02-24 MED ORDER — PENICILLIN G BENZATHINE 1200000 UNIT/2ML IM SUSP
1.2000 10*6.[IU] | Freq: Once | INTRAMUSCULAR | Status: AC
  Administered 2017-02-24: 1.2 10*6.[IU] via INTRAMUSCULAR
  Filled 2017-02-24: qty 2

## 2017-02-24 NOTE — ED Provider Notes (Signed)
[facility]-EMERGENCY DEPT Provider Note   CSN: [id number] Arrival date & time: 02/24/17  1514     History   Chief Complaint Chief Complaint  Patient presents with  . Sore Throat  . Groin Pain    HPI Vincent Braun is a 26 y.o. male presents the emergency department with chief complaint of sore throat. Patient states that he developed tonsillar swelling and cervical lymphadenopathy 3 days ago. Patient was seen at the [facility] and had a negative rapid strep test. He was given oral Decadron in his pain and swelling improved. He did have soaking night sweats that evening. Patient states that yesterday. History was also okay. However, today the swelling returned, he also has pain with swallowing but denies any change in voice, difficulty breathing or inability to swallow. Patient also complaining of pain in the right groin, which he states has been going on since prior to the onset of his pharyngitis. He states he was worried he might have a hernia in the right inguinal region. He does a lot of weight lifting and noticed that after doing a lot of heavy lower body exercises. Pain is worse with movement. He denies any testicle pain, the pain seems to come and go. He has no pain at this time and. Eyes, any penile discharge or lesions. He is married, in a monogamous relationship with a 2y/o daughter.   HPI  History reviewed. No pertinent past medical history.  There are no active problems to display for this patient.   Past Surgical History:  Procedure Laterality Date  . APPENDECTOMY         Home Medications    Prior to Admission medications   Medication Sig Start Date End Date Taking? Authorizing Provider  ibuprofen (ADVIL,MOTRIN) 800 MG tablet Take 1 tablet (800 mg total) by mouth every 8 (eight) hours as needed. 01/07/17   Chase Picket Ward, PA-C    Family History No family history on file.  Social History Social History  Substance Use Topics  . Smoking status: Never  Smoker  . Smokeless tobacco: Never Used  . Alcohol use No     Allergies   Patient has no known allergies.   Review of Systems Review of Systems  Ten systems reviewed and are negative for acute change, except as noted in the HPI.   Physical Exam Updated Vital Signs BP 98/85 (BP Location: Right Arm)   Pulse 62   Temp 97.9 F (36.6 C) (Oral)   Resp 12   SpO2 100%   Physical Exam  Constitutional: He appears well-developed and well-nourished. No distress.  HENT:  Head: Normocephalic and atraumatic.  Mouth/Throat: Uvula is midline. No oropharyngeal exudate or posterior oropharyngeal edema. Tonsils are 3+ on the right. Tonsils are 3+ on the left. No tonsillar exudate.  Eyes: Conjunctivae and EOM are normal. Pupils are equal, round, and reactive to light. No scleral icterus.  Neck: Normal range of motion. Neck supple.  Cardiovascular: Normal rate, regular rhythm and normal heart sounds.   Pulmonary/Chest: Effort normal and breath sounds normal. No respiratory distress.  Abdominal: Soft. There is no tenderness.  Musculoskeletal: He exhibits no edema.  Lymphadenopathy:       Head (right side): Tonsillar adenopathy present.       Head (left side): Tonsillar adenopathy present.  Neurological: He is alert.  Skin: Skin is warm and dry. He is not diaphoretic.  Psychiatric: His behavior is normal.  Nursing note and vitals reviewed.    ED Treatments /  Results  Labs (all labs ordered are listed, but only abnormal results are displayed) Labs Reviewed - No data to display  EKG  EKG Interpretation None       Radiology No results found.  Procedures Procedures (including critical care time)  Medications Ordered in ED Medications  penicillin g benzathine (BICILLIN LA) 1200000 UNIT/2ML injection 1.2 Million Units (not administered)     Initial Impression / Assessment and Plan / ED Course  I have reviewed the triage vital signs and the nursing notes.  Pertinent labs &  imaging results that were available during my care of the patient were reviewed by me and considered in my medical decision making (see chart for details).     Patient with tonsillitis. No sign of tonsillar abscess. Be treated prophylactically today with IM penicillin. I discussed return precautions with the patient. He has close follow-up at the [employer] where he is stationed. Patient appears safe for discharge. I discussed reasons to seek immediate medical care.  Final Clinical Impressions(s) / ED Diagnoses   Final diagnoses:  Tonsillitis    New Prescriptions New Prescriptions   No medications on file     Arthor Captain, PA-C 02/24/17 1630    Tilden Fossa, MD 02/25/17 [phone]

## 2017-02-24 NOTE — Discharge Instructions (Signed)
You have been treated with a one time dose of intramuscular penicillin. Take tylenol and advil for pain.  You may gargle and spit liquid benadryl which can numb your throat. Do not swallow the benadryl.   Follow these instructions at home: Rest as much as possible and get plenty of sleep. Drink plenty of fluids. While the throat is very sore, eat soft foods or liquids, such as sherbet, soups, or instant breakfast drinks. Eat frozen ice pops. Gargle with a warm or cold liquid to help soothe the throat. Mix 1/4 teaspoon of salt and 1/4 teaspoon of baking soda in 8 oz of water. Contact a health care provider if: Large, tender lumps develop in your neck. A rash develops. A green, yellow-brown, or bloody substance is coughed up. You are unable to swallow liquids or food for 24 hours. You notice that only one of the tonsils is swollen. Get help right away if: You develop any new symptoms such as vomiting, severe headache, stiff neck, chest pain, or trouble breathing or swallowing. You have severe throat pain along with drooling or voice changes. You have severe pain, unrelieved with recommended medications. You are unable to fully open the mouth. You develop redness, swelling, or severe pain anywhere in the neck. You have a fever.

## 2017-02-24 NOTE — ED Triage Notes (Signed)
He c/o sore throat x 4-5 days. He rece'd a "shot of Decadron" for "enlarged tonsils"; which made that improve--now back; plus some neck nodes which are tender. He further c/o some mild right groin tenderness. 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

## 2017-10-09 ENCOUNTER — Encounter (HOSPITAL_COMMUNITY): Payer: Self-pay | Admitting: Emergency Medicine

## 2017-10-09 ENCOUNTER — Emergency Department (HOSPITAL_COMMUNITY)
Admission: EM | Admit: 2017-10-09 | Discharge: 2017-10-09 | Disposition: A | Discharge status: Home / Self Care | Attending: Emergency Medicine | Admitting: Emergency Medicine

## 2017-10-09 DIAGNOSIS — K0889 Other specified disorders of teeth and supporting structures: Secondary | ICD-10-CM

## 2017-10-09 MED ORDER — PENICILLIN V POTASSIUM 500 MG PO TABS: 500.0000 mg | ORAL_TABLET | Freq: Three times a day (TID) | ORAL | 0 refills | Status: AC

## 2017-10-09 MED ORDER — NAPROXEN 250 MG PO TABS
500.0000 mg | ORAL_TABLET | Freq: Once | ORAL | Status: AC
  Administered 2017-10-09: 500 mg via ORAL
  Filled 2017-10-09: qty 2

## 2017-10-09 MED ORDER — NAPROXEN 500 MG PO TABS: 500.0000 mg | ORAL_TABLET | Freq: Two times a day (BID) | ORAL | 0 refills | Status: AC

## 2017-10-09 MED ORDER — PENICILLIN V POTASSIUM 500 MG PO TABS
500.0000 mg | ORAL_TABLET | Freq: Once | ORAL | Status: AC
  Administered 2017-10-09: 500 mg via ORAL
  Filled 2017-10-09: qty 1

## 2017-10-09 NOTE — ED Triage Notes (Signed)
Pt c/o right upper tooth pain x 2 days. States tooth is sensitive to hot and cold. Denies oral trauma.

## 2017-10-09 NOTE — Discharge Instructions (Signed)
Please read and follow all provided instructions.  Your diagnoses today include:  1. Toothache     The exam and treatment you received today has been provided on an emergency basis only. This is not a substitute for complete medical or dental care.  Tests performed today include:  Vital signs. See below for your results today.   Medications prescribed:   Penicillin - antibiotic  You have been prescribed an antibiotic medicine: take the entire course of medicine even if you are feeling better. Stopping early can cause the antibiotic not to work.   Naproxen - anti-inflammatory pain medication  Do not exceed 500mg  naproxen every 12 hours, take with food  You have been prescribed an anti-inflammatory medication or NSAID. Take with food. Take smallest effective dose for the shortest duration needed for your pain. Stop taking if you experience stomach pain or vomiting.   Take any prescribed medications only as directed.  Home care instructions:  Follow any educational materials contained in this packet.  Follow-up instructions: Please follow-up with your dentist for further evaluation of your symptoms.   Dental Assistance: See attached dental referrals  Return instructions:   Please return to the Emergency Department if you experience worsening symptoms.  Please return if you develop a fever, you develop more swelling in your face or neck, you have trouble breathing or swallowing food.  Please return if you have any other emergent concerns.  Additional Information:  Your vital signs today were: BP (!) 153/77 (BP Location: Right Arm)    Pulse (!) 59    Temp 98.2 F (36.8 C) (Oral)    Resp 19    SpO2 97%  If your blood pressure (BP) was elevated above 135/85 this visit, please have this repeated by your doctor within one month. --------------

## 2017-10-09 NOTE — ED Provider Notes (Signed)
[facility] Provider Note   CSN: [id number] Arrival date & time: 10/09/17  0029     History   Chief Complaint Chief Complaint  Patient presents with  . Dental Pain    HPI Vincent Braun is a 26 y.o. male.  Patient presents with 2 days of right upper molar pain.  Symptoms began with hot and cold sensitivity.  Tonight pain is more persistent.  No injury or trauma.  Patient has a history of feelings.  He has a feeling of mild subjective swelling.  No facial swelling.  No neck pain or swelling, difficulty swallowing.  No treatments prior to arrival.      History reviewed. No pertinent past medical history.  There are no active problems to display for this patient.   Past Surgical History:  Procedure Laterality Date  . APPENDECTOMY         Home Medications    Prior to Admission medications   Medication Sig Start Date End Date Taking? Authorizing Provider  ibuprofen (ADVIL,MOTRIN) 800 MG tablet Take 1 tablet (800 mg total) by mouth every 8 (eight) hours as needed. 01/07/17   Ward, Chase PicketJaime Pilcher, PA-C  naproxen (NAPROSYN) 500 MG tablet Take 1 tablet (500 mg total) 2 (two) times daily by mouth. 10/09/17   Renne CriglerGeiple, Avante Carneiro, PA-C  penicillin v potassium (VEETID) 500 MG tablet Take 1 tablet (500 mg total) 3 (three) times daily by mouth. 10/09/17   Renne CriglerGeiple, Aryan Bello, PA-C    Family History No family history on file.  Social History Social History   Tobacco Use  . Smoking status: Never Smoker  . Smokeless tobacco: Never Used  Substance Use Topics  . Alcohol use: No    Alcohol/week: 0.0 oz  . Drug use: No     Allergies   Patient has no known allergies.   Review of Systems Review of Systems  Constitutional: Negative for fever.  HENT: Positive for dental problem and facial swelling. Negative for ear pain, sore throat and trouble swallowing.   Respiratory: Negative for shortness of breath and stridor.   Musculoskeletal:  Negative for neck pain.  Skin: Negative for color change.  Neurological: Negative for headaches.     Physical Exam Updated Vital Signs BP (!) 153/77 (BP Location: Right Arm)   Pulse (!) 59   Temp 98.2 F (36.8 C) (Oral)   Resp 19   SpO2 97%   Physical Exam  Constitutional: He appears well-developed and well-nourished.  HENT:  Head: Normocephalic and atraumatic.  Right Ear: Tympanic membrane, external ear and ear canal normal.  Left Ear: Tympanic membrane, external ear and ear canal normal.  Nose: Nose normal.  Mouth/Throat: Uvula is midline, oropharynx is clear and moist and mucous membranes are normal. No trismus in the jaw. Abnormal dentition. Dental caries present. No dental abscesses or uvula swelling. No tonsillar abscesses.  Patient with tenderness and pain at the area of tooth #2.  No palpable abscess.  Previous filling noted.  Eyes: Pupils are equal, round, and reactive to light.  Neck: Normal range of motion. Neck supple.  No neck swelling or Lugwig's angina  Neurological: He is alert.  Skin: Skin is warm and dry.  Psychiatric: He has a normal mood and affect.  Nursing note and vitals reviewed.    ED Treatments / Results   Procedures Procedures (including critical care time)  Medications Ordered in ED Medications  naproxen (NAPROSYN) tablet 500 mg (not administered)  penicillin v potassium (VEETID) tablet 500  mg (not administered)     Initial Impression / Assessment and Plan / ED Course  I have reviewed the triage vital signs and the nursing notes.  Pertinent labs & imaging results that were available during my care of the patient were reviewed by me and considered in my medical decision making (see chart for details).     1:10 AM Patient seen and examined. Medications ordered.   Vital signs reviewed and are as follows: BP (!) 153/77 (BP Location: Right Arm)   Pulse (!) 59   Temp 98.2 F (36.8 C) (Oral)   Resp 19   SpO2 97%   Patient counseled  to take prescribed medications as directed, return with worsening facial or neck swelling, and to follow-up with their dentist as soon as possible.   Final Clinical Impressions(s) / ED Diagnoses   Final diagnoses:  Toothache   Patient with toothache. No fever. Exam unconcerning for Ludwig's angina or other deep tissue infection in neck.   ED Discharge Orders        Ordered    penicillin v potassium (VEETID) 500 MG tablet  3 times daily     10/09/17 0106    naproxen (NAPROSYN) 500 MG tablet  2 times daily     10/09/17 0106       Renne CriglerGeiple, Westen Dinino, PA-C 10/09/17 0111    Zadie RhineWickline, Donald, MD 10/09/17 [phone]

## 2021-03-04 ENCOUNTER — Emergency Department (HOSPITAL_COMMUNITY): Payer: Self-pay

## 2021-03-04 ENCOUNTER — Encounter (HOSPITAL_COMMUNITY): Payer: Self-pay | Admitting: Emergency Medicine

## 2021-03-04 ENCOUNTER — Other Ambulatory Visit: Payer: Self-pay

## 2021-03-04 ENCOUNTER — Emergency Department (HOSPITAL_COMMUNITY)
Admission: EM | Admit: 2021-03-04 | Discharge: 2021-03-04 | Disposition: A | Discharge status: Home / Self Care | Payer: Self-pay | Attending: Emergency Medicine | Admitting: Emergency Medicine

## 2021-03-04 DIAGNOSIS — R0789 Other chest pain: Secondary | ICD-10-CM | POA: Insufficient documentation

## 2021-03-04 DIAGNOSIS — R002 Palpitations: Secondary | ICD-10-CM | POA: Insufficient documentation

## 2021-03-04 DIAGNOSIS — R0602 Shortness of breath: Secondary | ICD-10-CM

## 2021-03-04 DIAGNOSIS — R001 Bradycardia, unspecified: Secondary | ICD-10-CM | POA: Insufficient documentation

## 2021-03-04 LAB — CBC WITH DIFFERENTIAL/PLATELET
Abs Immature Granulocytes: 0.03 10*3/uL (ref 0.00–0.07)
Basophils Absolute: 0.1 10*3/uL (ref 0.0–0.1)
Basophils Relative: 1 %
Eosinophils Absolute: 0.4 10*3/uL (ref 0.0–0.5)
Eosinophils Relative: 4 %
HCT: 43.2 % (ref 39.0–52.0)
Hemoglobin: 15.1 g/dL (ref 13.0–17.0)
Immature Granulocytes: 0 %
Lymphocytes Relative: 47 %
Lymphs Abs: 4.1 10*3/uL — ABNORMAL HIGH (ref 0.7–4.0)
MCH: 30.5 pg (ref 26.0–34.0)
MCHC: 35 g/dL (ref 30.0–36.0)
MCV: 87.3 fL (ref 80.0–100.0)
Monocytes Absolute: 0.7 10*3/uL (ref 0.1–1.0)
Monocytes Relative: 8 %
Neutro Abs: 3.5 10*3/uL (ref 1.7–7.7)
Neutrophils Relative %: 40 %
Platelets: 234 10*3/uL (ref 150–400)
RBC: 4.95 MIL/uL (ref 4.22–5.81)
RDW: 12.5 % (ref 11.5–15.5)
WBC: 8.7 10*3/uL (ref 4.0–10.5)
nRBC: 0 % (ref 0.0–0.2)

## 2021-03-04 LAB — BASIC METABOLIC PANEL
Anion gap: 8 (ref 5–15)
BUN: 13 mg/dL (ref 6–20)
CO2: 27 mmol/L (ref 22–32)
Calcium: 9.5 mg/dL (ref 8.9–10.3)
Chloride: 103 mmol/L (ref 98–111)
Creatinine, Ser: 0.99 mg/dL (ref 0.61–1.24)
GFR, Estimated: >60 mL/min (ref >60)
Glucose, Bld: 104 mg/dL — ABNORMAL HIGH (ref 70–99)
Potassium: 3.6 mmol/L (ref 3.5–5.1)
Sodium: 138 mmol/L (ref 135–145)

## 2021-03-04 LAB — TROPONIN I (HIGH SENSITIVITY)
Troponin I (High Sensitivity): 11 ng/L (ref <18)
Troponin I (High Sensitivity): 12 ng/L (ref <18)

## 2021-03-04 LAB — D-DIMER, QUANTITATIVE: D-Dimer, Quant: <0.27 ug/mL-FEU (ref 0.00–0.50)

## 2021-03-04 NOTE — ED Triage Notes (Signed)
Emergency Medicine Provider Triage Evaluation Note  Vincent Braun , a 30 y.o. male  was evaluated in triage.  Pt complains of palpations and chest discomfort.  Symptoms started after blood drawn at [facility] last week.  Last felt palpations 2 days ago.  Chest discomfort left sided; started at 1000 this am.  Pain is intermittent.  Worse with activity.   Patient is a long distance truck driver.     Review of Systems  Positive: Chest discomfort, palpitations, shob, anxiety  Negative: Cough, nausea, vomiting, diaphoresis, hemoptysis, leg swelling    Physical Exam  BP (!) 144/84 (BP Location: Left Arm)   Pulse (!) 58   Temp 98 F (36.7 C) (Oral)   Resp 16   SpO2 100%  Gen:   Awake, no distress   HEENT:  Atraumatic  Resp:  Normal effort, lungs to clear to auscultations, speaks in full complete sentences  Cardiac:  Normal rate  MSK:   Moves extremities without difficulty  Neuro:  Speech clear   Medical Decision Making  Medically screening exam initiated at 6:32 PM.  Appropriate orders placed.  Vincent Braun was informed that the remainder of the evaluation will be completed by another provider, this initial triage assessment does not replace that evaluation, and the importance of remaining in the ED until their evaluation is complete.  Clinical Impression   Chest pain.  The patient appears stable so that the remainder of the work up may be completed by another provider.     Haskel Schroeder, [place] 03/04/21 1839

## 2021-03-04 NOTE — ED Notes (Signed)
Dr little at bedside 

## 2021-03-04 NOTE — ED Triage Notes (Signed)
Pt coming from home complaint of intermittent CP and shortness of breath since 1000 this morning.

## 2021-03-04 NOTE — ED Provider Notes (Signed)
[facility] EMERGENCY DEPARTMENT Provider Note   CSN: [id number] Arrival date & time: 03/04/21  1813     History Chief Complaint  Patient presents with  . Chest Pain  . Shortness of Breath    Vincent Braun is a 30 y.o. male.  30 year old male who presents with heart palpitations and chest pain.  2 days ago, patient was laying down resting in his truck when he began having a heart fluttering sensation.  It happened once or twice and then resolved.  It has not happened again.  Earlier today sometime between 10 and 12 this morning, he had some very mild left-sided chest discomfort that was not a severe pain, it seemed to get better when he rolled onto his side.  He denies any associated nausea, vomiting, diaphoresis, shortness of breath, or other complaints.  The chest pain has not recurred since earlier today.  He denies any leg swelling/pain, URI symptoms, or alcohol/drug use.  No family history of heart disease in immediate family members but does report heart disease in grandparents.  He is a Agricultural consultant.  No personal history of blood clots.  The history is provided by the patient.       History reviewed. No pertinent past medical history.  There are no problems to display for this patient.   Past Surgical History:  Procedure Laterality Date  . APPENDECTOMY         History reviewed. No pertinent family history.  Social History   Tobacco Use  . Smoking status: Never Smoker  . Smokeless tobacco: Never Used  Substance Use Topics  . Alcohol use: No    Alcohol/week: 0.0 standard drinks  . Drug use: No    Home Medications Prior to Admission medications   Medication Sig Start Date End Date Taking? Authorizing Provider  ibuprofen (ADVIL,MOTRIN) 800 MG tablet Take 1 tablet (800 mg total) by mouth every 8 (eight) hours as needed. 01/07/17   Ward, Chase Picket, PA-C  naproxen (NAPROSYN) 500 MG tablet Take 1 tablet (500 mg total) 2 (two)  times daily by mouth. 10/09/17   Renne Crigler, PA-C  penicillin v potassium (VEETID) 500 MG tablet Take 1 tablet (500 mg total) 3 (three) times daily by mouth. 10/09/17   Renne Crigler, PA-C    Allergies    Patient has no known allergies.  Review of Systems   Review of Systems All other systems reviewed and are negative except that which was mentioned in HPI  Physical Exam Updated Vital Signs BP 112/87 (BP Location: Right Arm)   Pulse 60   Temp 98 F (36.7 C)   Resp 19   SpO2 99%   Physical Exam Constitutional:      General: He is not in acute distress.    Appearance: Normal appearance.  HENT:     Head: Normocephalic and atraumatic.  Eyes:     Conjunctiva/sclera: Conjunctivae normal.  Cardiovascular:     Rate and Rhythm: Regular rhythm. Bradycardia present.     Heart sounds: Normal heart sounds. No murmur heard.   Pulmonary:     Effort: Pulmonary effort is normal.     Breath sounds: Normal breath sounds.  Abdominal:     General: Abdomen is flat. Bowel sounds are normal. There is no distension.     Palpations: Abdomen is soft.     Tenderness: There is no abdominal tenderness.  Musculoskeletal:     Right lower leg: No edema.     Left lower  leg: No edema.  Skin:    General: Skin is warm and dry.  Neurological:     Mental Status: He is alert and oriented to person, place, and time.     Comments: fluent  Psychiatric:        Mood and Affect: Mood normal.        Behavior: Behavior normal.     ED Results / Procedures / Treatments   Labs (all labs ordered are listed, but only abnormal results are displayed) Labs Reviewed  BASIC METABOLIC PANEL - Abnormal; Notable for the following components:      Result Value   Glucose, Bld 104 (*)    All other components within normal limits  CBC WITH DIFFERENTIAL/PLATELET - Abnormal; Notable for the following components:   Lymphs Abs 4.1 (*)    All other components within normal limits  D-DIMER, QUANTITATIVE  TROPONIN I  (HIGH SENSITIVITY)  TROPONIN I (HIGH SENSITIVITY)    EKG EKG Interpretation  Date/Time:  Friday March 04 2021 18:30:54 EDT Ventricular Rate:  70 PR Interval:  158 QRS Duration: 112 QT Interval:  386 QTC Calculation: 416 R Axis:   90 Text Interpretation: Normal sinus rhythm with sinus arrhythmia Rightward axis Borderline ECG No previous ECGs available Confirmed by Frederick Peers [phone]) on 03/04/2021 9:08:32 PM   Radiology DG Chest 2 View  Result Date: 03/04/2021 CLINICAL DATA:  Chest pain and shortness of breath. EXAM: CHEST - 2 VIEW COMPARISON:  None. FINDINGS: The lungs are clear without focal pneumonia, edema, pneumothorax or pleural effusion. The cardiopericardial silhouette is within normal limits for size. The visualized bony structures of the thorax show no acute abnormality. IMPRESSION: No active cardiopulmonary disease. Electronically Signed   By: Kennith Center M.D.   On: 03/04/2021 19:37    Procedures Procedures   Medications Ordered in ED Medications - No data to display  ED Course  I have reviewed the triage vital signs and the nursing notes.  Pertinent labs & imaging results that were available during my care of the patient were reviewed by me and considered in my medical decision making (see chart for details).    MDM Rules/Calculators/A&P                          Well-appearing, mildly anxious but otherwise reassuring exam.  EKG shows sinus rhythm with no ischemic changes.  Chest x-ray clear.  Troponin is negative and it has been more than 2 hours since the patient's episode of pain therefore I do not feel he needs serial troponins especially given symptoms very atypical for ACS.  He has no risk factors for early heart disease.  Obtain D-dimer because of his truck driving history, D-dimer was negative making PE very unlikely.  Reviewed results with patient and instructed to follow-up with PCP as he had recent lab work drawn that showed borderline elevated  cholesterol.  I have reviewed return precautions and he voiced understanding. Final Clinical Impression(s) / ED Diagnoses Final diagnoses:  Atypical chest pain  Palpitations    Rx / DC Orders ED Discharge Orders    None       Lestat Golob, Ambrose [place], MD 03/04/21 [phone]

## 2021-08-06 ENCOUNTER — Encounter (HOSPITAL_BASED_OUTPATIENT_CLINIC_OR_DEPARTMENT_OTHER): Payer: Self-pay | Admitting: *Deleted

## 2021-08-06 ENCOUNTER — Emergency Department (HOSPITAL_BASED_OUTPATIENT_CLINIC_OR_DEPARTMENT_OTHER)
Admission: EM | Admit: 2021-08-06 | Discharge: 2021-08-06 | Disposition: A | Discharge status: Home / Self Care | Payer: Self-pay | Attending: Emergency Medicine | Admitting: Emergency Medicine

## 2021-08-06 ENCOUNTER — Other Ambulatory Visit: Payer: Self-pay

## 2021-08-06 DIAGNOSIS — N5089 Other specified disorders of the male genital organs: Secondary | ICD-10-CM | POA: Insufficient documentation

## 2021-08-06 DIAGNOSIS — L293 Anogenital pruritus, unspecified: Secondary | ICD-10-CM

## 2021-08-06 LAB — URINALYSIS, ROUTINE W REFLEX MICROSCOPIC
Bilirubin Urine: NEGATIVE
Glucose, UA: NEGATIVE mg/dL
Hgb urine dipstick: NEGATIVE
Ketones, ur: NEGATIVE mg/dL
Leukocytes,Ua: NEGATIVE
Nitrite: NEGATIVE
Protein, ur: NEGATIVE mg/dL
Specific Gravity, Urine: 1.015 (ref 1.005–1.030)
pH: 6 (ref 5.0–8.0)

## 2021-08-06 NOTE — ED Provider Notes (Signed)
[facility] EMERGENCY DEPARTMENT Provider Note   CSN: [id number] Arrival date & time: 08/06/21  1611     History Chief Complaint  Patient presents with   Male GU Problem    Vincent Braun is a 30 y.o. male.  Patient presents to the emergency department for evaluation of penile discomfort ongoing over the past couple of days.  He states that he feels like he has itching internally in the tip of his penis.  No dysuria, penile discharge, or other urinary symptoms.  No skin changes, lesions or rashes.  Patient does state that he recently started using a new soap.  He has 1 sexual partner.  Unclear if they have any symptoms.  No treatments prior to arrival.      History reviewed. No pertinent past medical history.  There are no problems to display for this patient.   Past Surgical History:  Procedure Laterality Date   APPENDECTOMY         No family history on file.  Social History   Tobacco Use   Smoking status: Never   Smokeless tobacco: Never  Vaping Use   Vaping Use: Never used  Substance Use Topics   Alcohol use: Not Currently   Drug use: No    Home Medications Prior to Admission medications   Medication Sig Start Date End Date Taking? Authorizing Provider  ibuprofen (ADVIL,MOTRIN) 800 MG tablet Take 1 tablet (800 mg total) by mouth every 8 (eight) hours as needed. 01/07/17   Ward, Chase Picket, PA-C  naproxen (NAPROSYN) 500 MG tablet Take 1 tablet (500 mg total) 2 (two) times daily by mouth. 10/09/17   Renne Crigler, PA-C  penicillin v potassium (VEETID) 500 MG tablet Take 1 tablet (500 mg total) 3 (three) times daily by mouth. 10/09/17   Renne Crigler, PA-C    Allergies    Patient has no known allergies.  Review of Systems   Review of Systems  Constitutional:  Negative for fever.  HENT:  Negative for sore throat.   Eyes:  Negative for discharge.  Genitourinary:  Positive for penile pain (itching). Negative for dysuria, frequency, genital  sores, penile discharge and testicular pain.  Musculoskeletal:  Negative for arthralgias.  Skin:  Negative for rash.  Hematological:  Negative for adenopathy.   Physical Exam Updated Vital Signs BP 132/82 (BP Location: Left Arm)   Pulse (!) 56   Temp 98.6 F (37 C) (Oral)   Resp 16   Ht 5\' 10"  (1.778 m)   Wt 79.4 kg   SpO2 98%   BMI 25.11 kg/m   Physical Exam Vitals and nursing note reviewed.  Constitutional:      Appearance: He is well-developed.  HENT:     Head: Normocephalic and atraumatic.  Eyes:     Conjunctiva/sclera: Conjunctivae normal.  Pulmonary:     Effort: No respiratory distress.  Genitourinary:    Comments: Normal external GU exam.  No penile drainage or skin lesions. Musculoskeletal:     Cervical back: Normal range of motion and neck supple.  Skin:    General: Skin is warm and dry.  Neurological:     Mental Status: He is alert.    ED Results / Procedures / Treatments   Labs (all labs ordered are listed, but only abnormal results are displayed) Labs Reviewed  URINALYSIS, ROUTINE W REFLEX MICROSCOPIC  GC/CHLAMYDIA PROBE AMP ([place]) NOT AT [facility]    EKG None  Radiology No results found.  Procedures Procedures  Medications Ordered in ED Medications - No data to display  ED Course  I have reviewed the triage vital signs and the nursing notes.  Pertinent labs & imaging results that were available during my care of the patient were reviewed by me and considered in my medical decision making (see chart for details).  Patient seen and examined.  UA is negative.  GC/chlamydia pending.  Vital signs reviewed and are as follows: BP 132/82 (BP Location: Left Arm)   Pulse (!) 56   Temp 98.6 F (37 C) (Oral)   Resp 16   Ht 5\' 10"  (1.778 m)   Wt 79.4 kg   SpO2 98%   BMI 25.11 kg/m   Patient will think about changing soaps and other potential irritant sources.    MDM Rules/Calculators/A&P                           Penile  irritation, normal exam and UA.  Awaiting STD testing.   Final Clinical Impression(s) / ED Diagnoses Final diagnoses:  Itching of penis    Rx / DC Orders ED Discharge Orders     None        , PA-C 08/06/21 1729    10/06/21, MD 08/08/21 1302

## 2021-08-06 NOTE — Discharge Instructions (Signed)
Please read and follow all provided instructions.  Your diagnoses today include:  1. Itching of penis     Tests performed today include: Urine test - was normal Test for gonorrhea and chlamydia -pending and will return in the next 48 hours Vital signs. See below for your results today.   Medications prescribed:  None  Take any prescribed medications only as directed.  Home care instructions:  Follow any educational materials contained in this packet.  BE VERY CAREFUL not to take multiple medicines containing Tylenol (also called acetaminophen). Doing so can lead to an overdose which can damage your liver and cause liver failure and possibly death.   Follow-up instructions: Please follow-up with your primary care provider as needed for further evaluation of your symptoms.   Return instructions:  Please return to the Emergency Department if you experience worsening symptoms.  Please return if you have any other emergent concerns.  Additional Information:  Your vital signs today were: BP 132/82 (BP Location: Left Arm)   Pulse (!) 56   Temp 98.6 F (37 C) (Oral)   Resp 16   Ht 5\' 10"  (1.778 m)   Wt 79.4 kg   SpO2 98%   BMI 25.11 kg/m  If your blood pressure (BP) was elevated above 135/85 this visit, please have this repeated by your doctor within one month. --------------

## 2021-08-06 NOTE — ED Triage Notes (Signed)
Pt reports itching in his urethra since early this week after having intercourse with his son's mother.

## 2021-08-08 LAB — GC/CHLAMYDIA PROBE AMP (~~LOC~~) NOT AT ARMC
Chlamydia: NEGATIVE
Comment: NEGATIVE
Comment: NORMAL
Neisseria Gonorrhea: NEGATIVE

## 2022-03-28 ENCOUNTER — Other Ambulatory Visit: Payer: Self-pay

## 2022-03-28 ENCOUNTER — Emergency Department (HOSPITAL_COMMUNITY)
Admission: EM | Admit: 2022-03-28 | Discharge: 2022-03-29 | Discharge status: Against Medical Advice | Payer: No Typology Code available for payment source | Attending: Emergency Medicine | Admitting: Emergency Medicine

## 2022-03-28 ENCOUNTER — Encounter (HOSPITAL_COMMUNITY): Payer: Self-pay | Admitting: Emergency Medicine

## 2022-03-28 DIAGNOSIS — S65402A Unspecified injury of blood vessel of left thumb, initial encounter: Secondary | ICD-10-CM | POA: Diagnosis present

## 2022-03-28 DIAGNOSIS — Z5321 Procedure and treatment not carried out due to patient leaving prior to being seen by health care provider: Secondary | ICD-10-CM | POA: Diagnosis not present

## 2022-03-28 DIAGNOSIS — S61012A Laceration without foreign body of left thumb without damage to nail, initial encounter: Secondary | ICD-10-CM | POA: Insufficient documentation

## 2022-03-28 DIAGNOSIS — W260XXA Contact with knife, initial encounter: Secondary | ICD-10-CM | POA: Insufficient documentation

## 2022-03-28 NOTE — ED Triage Notes (Signed)
Pt presents with laceration to left thumb when the knife slipped while cutting a zip tie.  Bleeding controlled.  ?

## 2022-03-29 NOTE — ED Notes (Signed)
Patient states he is leaving d/t wait time 

## 2022-04-24 IMAGING — DX DG CHEST 2V
2 series · 2 of 2 positions shown · non-contrast
Comparison: None.

CLINICAL DATA: Chest pain and shortness of breath.

EXAM:
CHEST - 2 VIEW

[chest pa]
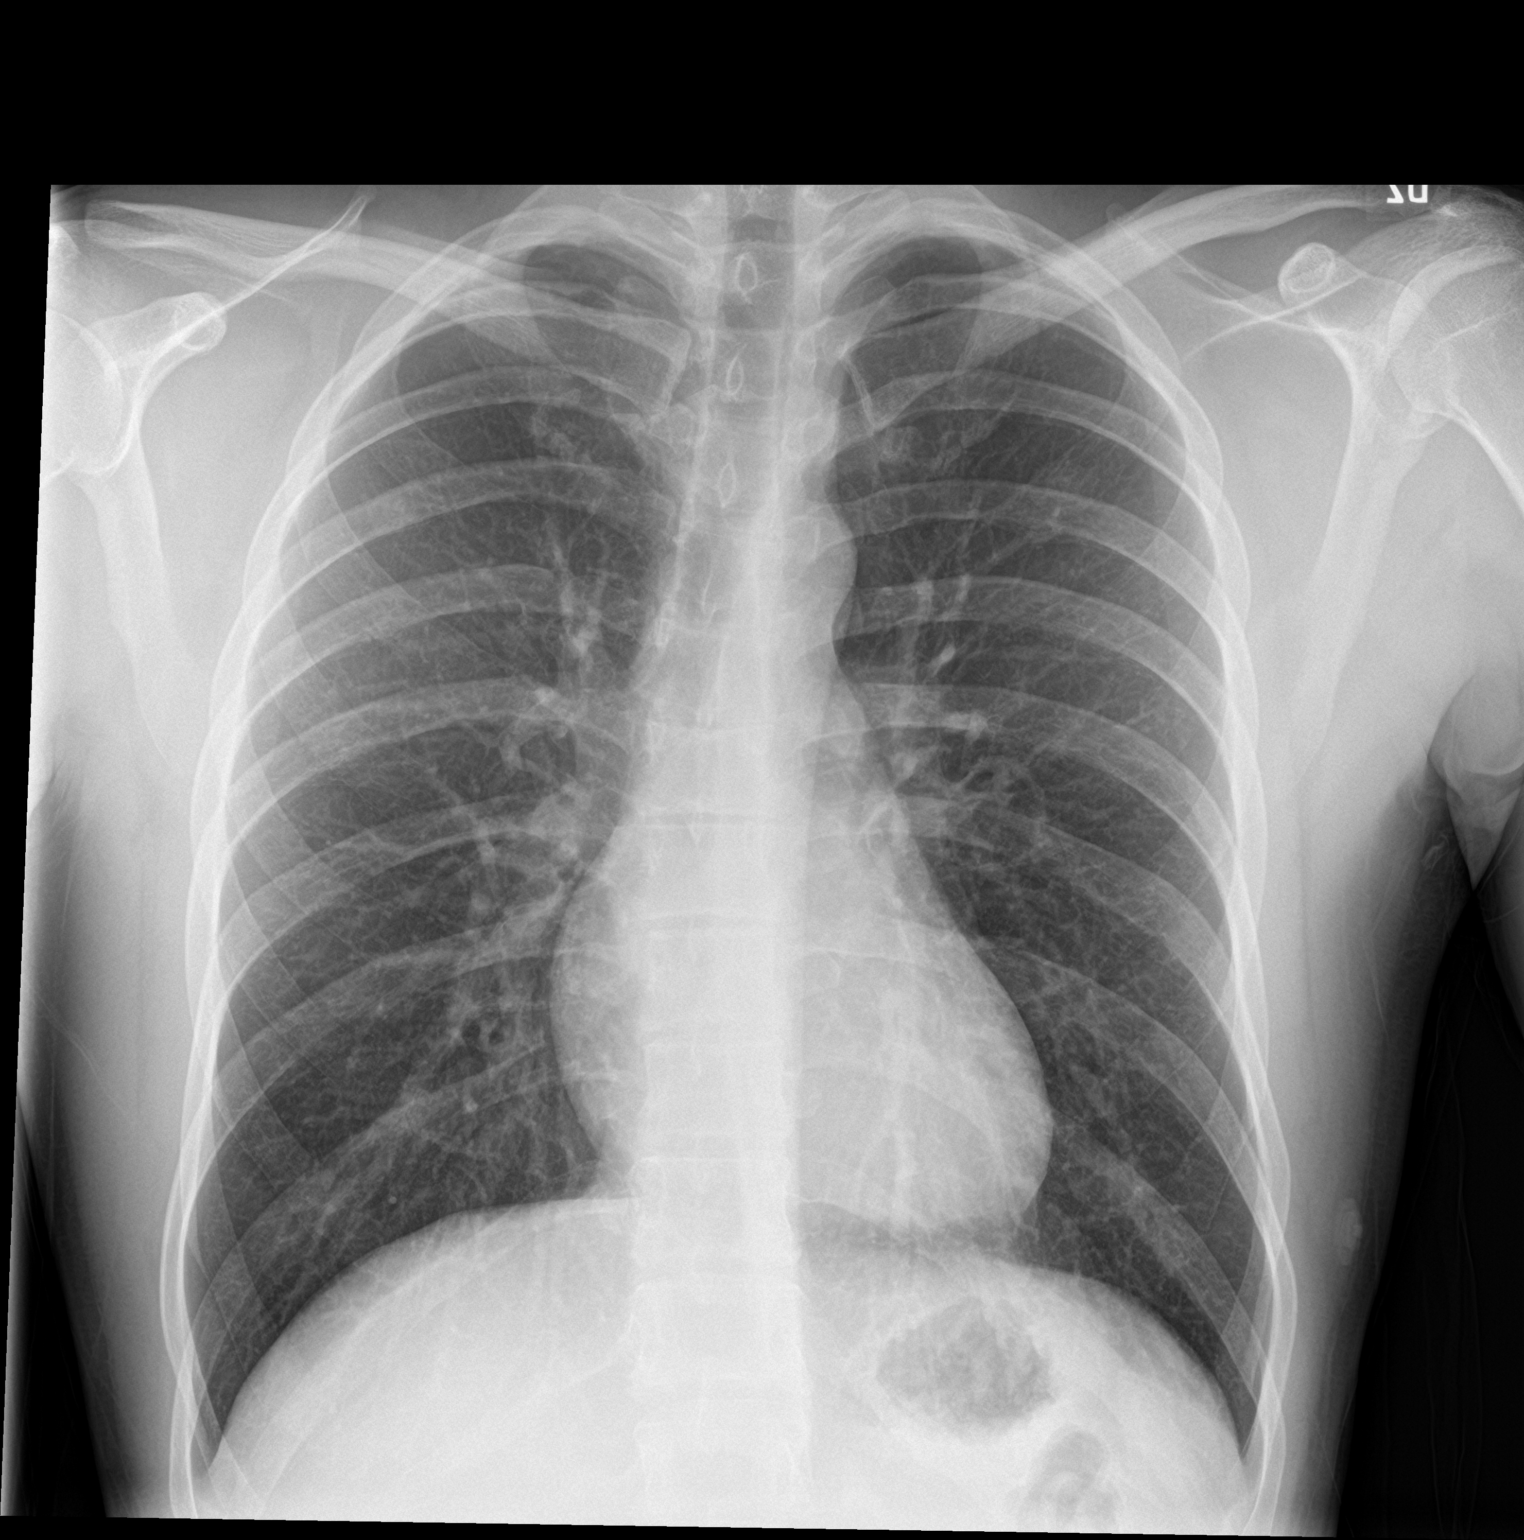

[chest lat]
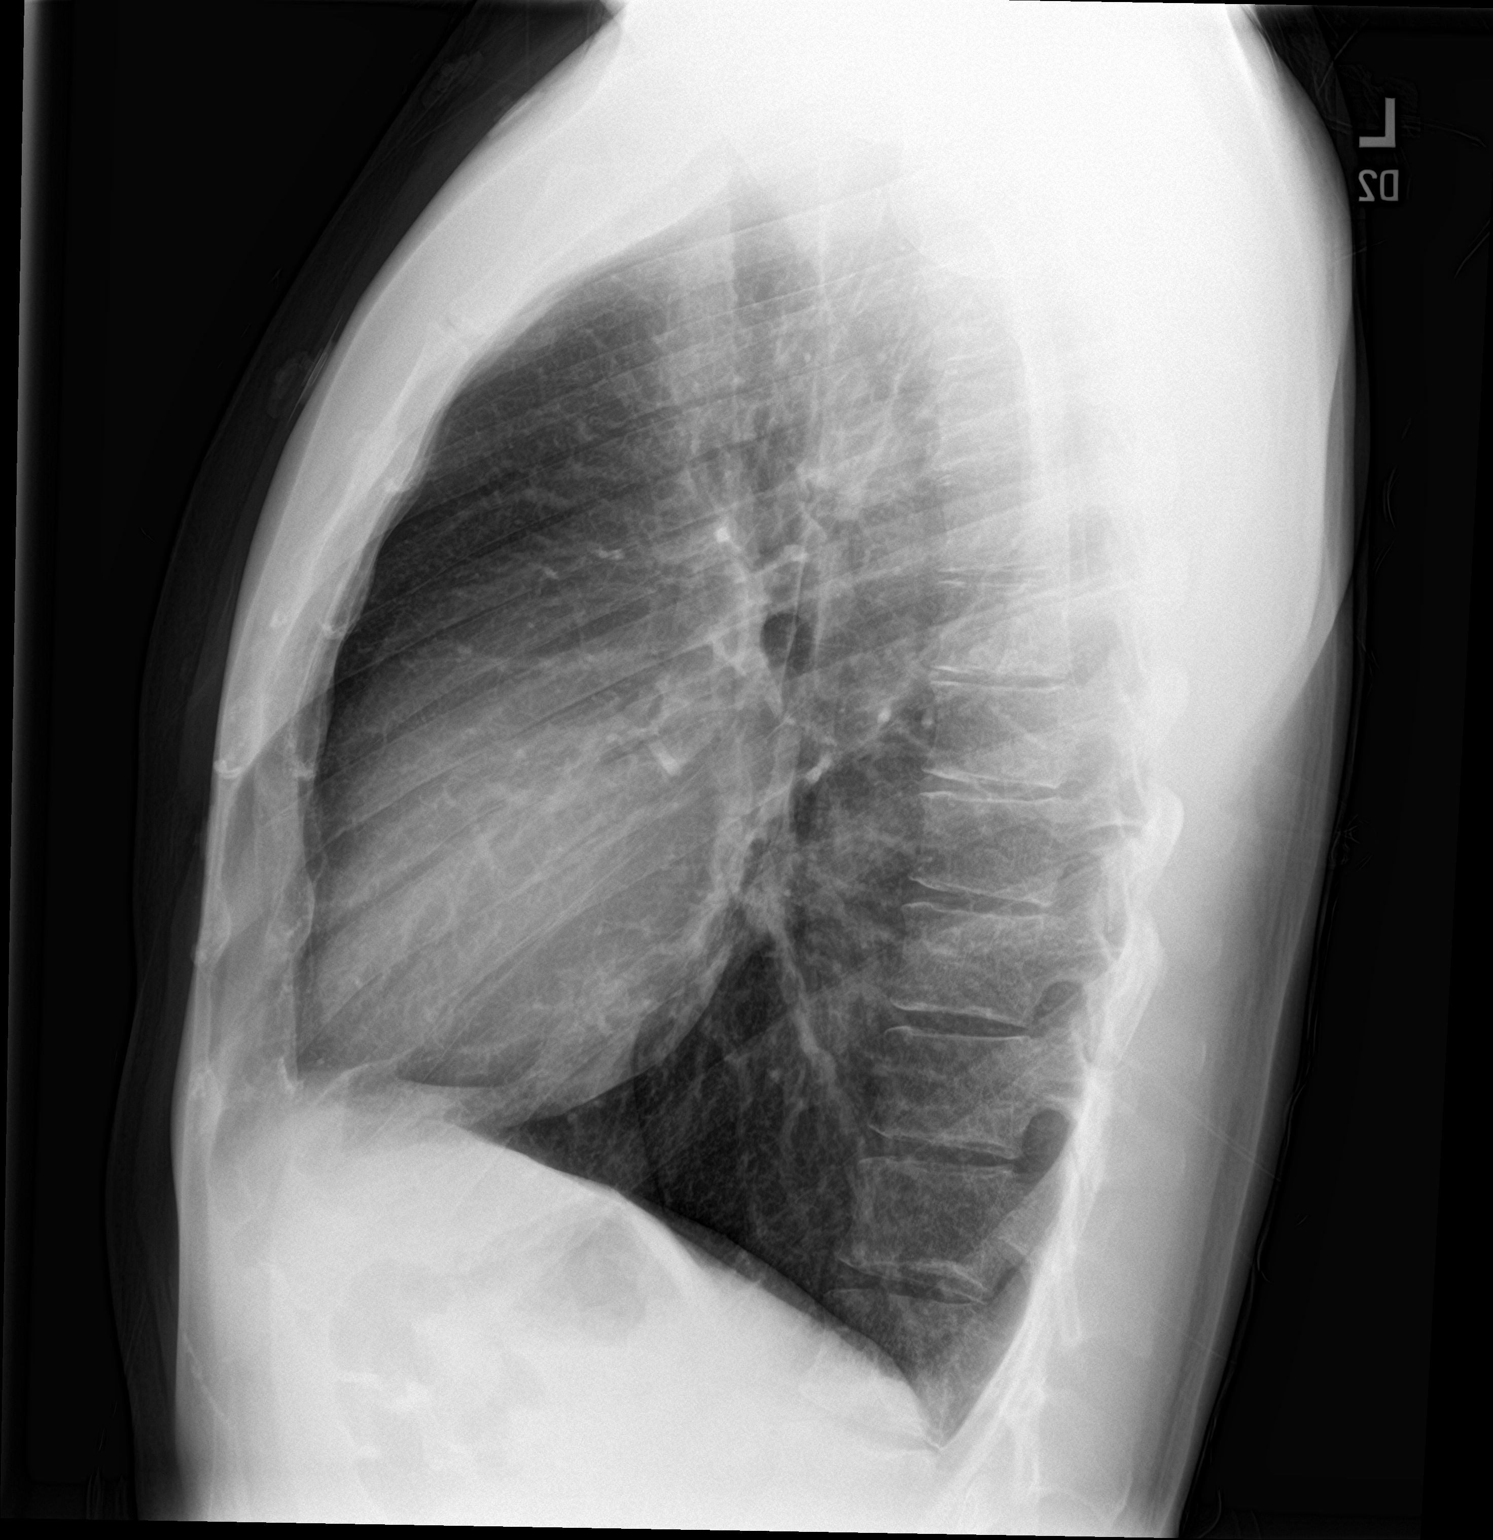

[2 of 2 positions shown; findings below may reference images not displayed]

FINDINGS: The lungs are clear without focal pneumonia, edema, pneumothorax or
pleural effusion. The cardiopericardial silhouette is within normal
limits for size. The visualized bony structures of the thorax show
no acute abnormality.
IMPRESSION: No active cardiopulmonary disease.
